# Patient Record
Sex: Female | Born: 1955 | Hispanic: No | Smoking: Never smoker
Health system: Southern US, Community
[De-identification: ages and names within clinical notes are randomized; demographics above are authoritative.]

## PROBLEM LIST (undated history)

## (undated) DIAGNOSIS — M1990 Unspecified osteoarthritis, unspecified site: Secondary | ICD-10-CM

## (undated) HISTORY — PX: MYOMECTOMY: SHX85

## (undated) HISTORY — PX: COLONOSCOPY: SHX174

## (undated) HISTORY — DX: Unspecified osteoarthritis, unspecified site: M19.90

---

## 1998-12-22 ENCOUNTER — Other Ambulatory Visit: Admission: RE | Admit: 1998-12-22 | Discharge: 1998-12-22 | Payer: Self-pay | Admitting: Family Medicine

## 1999-02-16 ENCOUNTER — Other Ambulatory Visit: Admission: RE | Admit: 1999-02-16 | Discharge: 1999-02-16 | Payer: Self-pay | Admitting: Gynecology

## 1999-07-20 ENCOUNTER — Encounter: Payer: Self-pay | Admitting: Family Medicine

## 1999-07-20 ENCOUNTER — Encounter: Admission: RE | Admit: 1999-07-20 | Discharge: 1999-07-20 | Payer: Self-pay | Admitting: Family Medicine

## 1999-08-04 ENCOUNTER — Encounter: Payer: Self-pay | Admitting: Family Medicine

## 1999-08-04 ENCOUNTER — Encounter: Admission: RE | Admit: 1999-08-04 | Discharge: 1999-08-04 | Payer: Self-pay | Admitting: Family Medicine

## 1999-08-09 ENCOUNTER — Other Ambulatory Visit: Admission: RE | Admit: 1999-08-09 | Discharge: 1999-08-09 | Payer: Self-pay | Admitting: Gynecology

## 2000-09-15 ENCOUNTER — Emergency Department (HOSPITAL_COMMUNITY): Admission: EM | Admit: 2000-09-15 | Discharge: 2000-09-15 | Payer: Self-pay | Admitting: Emergency Medicine

## 2000-09-21 ENCOUNTER — Inpatient Hospital Stay (HOSPITAL_COMMUNITY): Admission: AD | Admit: 2000-09-21 | Discharge: 2000-09-21 | Payer: Self-pay | Admitting: Gynecology

## 2000-10-30 ENCOUNTER — Encounter: Admission: RE | Admit: 2000-10-30 | Discharge: 2000-10-30 | Payer: Self-pay | Admitting: Internal Medicine

## 2000-10-30 ENCOUNTER — Encounter: Payer: Self-pay | Admitting: Internal Medicine

## 2000-11-20 ENCOUNTER — Encounter: Payer: Self-pay | Admitting: Internal Medicine

## 2000-11-20 ENCOUNTER — Encounter: Admission: RE | Admit: 2000-11-20 | Discharge: 2000-11-20 | Payer: Self-pay | Admitting: Internal Medicine

## 2001-01-28 ENCOUNTER — Encounter: Admission: RE | Admit: 2001-01-28 | Discharge: 2001-01-28 | Payer: Self-pay | Admitting: Obstetrics and Gynecology

## 2001-01-28 ENCOUNTER — Encounter: Payer: Self-pay | Admitting: Obstetrics and Gynecology

## 2001-04-03 ENCOUNTER — Ambulatory Visit (HOSPITAL_COMMUNITY): Admission: RE | Admit: 2001-04-03 | Discharge: 2001-04-03 | Payer: Self-pay | Admitting: Obstetrics and Gynecology

## 2001-04-03 ENCOUNTER — Encounter: Payer: Self-pay | Admitting: Obstetrics and Gynecology

## 2002-01-08 ENCOUNTER — Encounter: Admission: RE | Admit: 2002-01-08 | Discharge: 2002-01-08 | Payer: Self-pay | Admitting: Internal Medicine

## 2002-01-08 ENCOUNTER — Encounter: Payer: Self-pay | Admitting: Internal Medicine

## 2002-04-29 ENCOUNTER — Encounter: Payer: Self-pay | Admitting: Obstetrics and Gynecology

## 2002-04-29 ENCOUNTER — Encounter: Admission: RE | Admit: 2002-04-29 | Discharge: 2002-04-29 | Payer: Self-pay | Admitting: Obstetrics and Gynecology

## 2002-06-10 ENCOUNTER — Ambulatory Visit (HOSPITAL_COMMUNITY): Admission: RE | Admit: 2002-06-10 | Discharge: 2002-06-10 | Payer: Self-pay | Admitting: *Deleted

## 2002-06-24 ENCOUNTER — Ambulatory Visit (HOSPITAL_COMMUNITY): Admission: RE | Admit: 2002-06-24 | Discharge: 2002-06-24 | Payer: Self-pay | Admitting: Obstetrics and Gynecology

## 2002-09-15 ENCOUNTER — Encounter: Payer: Self-pay | Admitting: Internal Medicine

## 2002-09-15 ENCOUNTER — Encounter: Admission: RE | Admit: 2002-09-15 | Discharge: 2002-09-15 | Payer: Self-pay | Admitting: Internal Medicine

## 2003-03-04 ENCOUNTER — Encounter: Payer: Self-pay | Admitting: Internal Medicine

## 2003-03-04 ENCOUNTER — Ambulatory Visit (HOSPITAL_COMMUNITY): Admission: RE | Admit: 2003-03-04 | Discharge: 2003-03-04 | Payer: Self-pay | Admitting: Internal Medicine

## 2003-03-05 ENCOUNTER — Ambulatory Visit (HOSPITAL_COMMUNITY): Admission: RE | Admit: 2003-03-05 | Discharge: 2003-03-05 | Payer: Self-pay | Admitting: Internal Medicine

## 2003-03-05 ENCOUNTER — Encounter: Payer: Self-pay | Admitting: Internal Medicine

## 2003-09-15 ENCOUNTER — Other Ambulatory Visit: Admission: RE | Admit: 2003-09-15 | Discharge: 2003-09-15 | Payer: Self-pay | Admitting: Family Medicine

## 2003-11-02 ENCOUNTER — Ambulatory Visit (HOSPITAL_COMMUNITY): Admission: RE | Admit: 2003-11-02 | Discharge: 2003-11-02 | Payer: Self-pay | Admitting: Family Medicine

## 2003-11-12 ENCOUNTER — Encounter: Admission: RE | Admit: 2003-11-12 | Discharge: 2003-11-12 | Payer: Self-pay | Admitting: Specialist

## 2004-10-11 ENCOUNTER — Other Ambulatory Visit: Admission: RE | Admit: 2004-10-11 | Discharge: 2004-10-11 | Payer: Self-pay | Admitting: Family Medicine

## 2004-10-17 ENCOUNTER — Encounter: Admission: RE | Admit: 2004-10-17 | Discharge: 2004-10-17 | Payer: Self-pay | Admitting: Family Medicine

## 2005-03-14 ENCOUNTER — Emergency Department (HOSPITAL_COMMUNITY): Admission: EM | Admit: 2005-03-14 | Discharge: 2005-03-14 | Payer: Self-pay | Admitting: Emergency Medicine

## 2005-04-29 ENCOUNTER — Emergency Department (HOSPITAL_COMMUNITY): Admission: EM | Admit: 2005-04-29 | Discharge: 2005-04-29 | Payer: Self-pay | Admitting: Emergency Medicine

## 2005-05-03 ENCOUNTER — Encounter: Admission: RE | Admit: 2005-05-03 | Discharge: 2005-05-30 | Payer: Self-pay | Admitting: Family Medicine

## 2005-10-15 ENCOUNTER — Ambulatory Visit (HOSPITAL_COMMUNITY): Admission: RE | Admit: 2005-10-15 | Discharge: 2005-10-15 | Payer: Self-pay | Admitting: Internal Medicine

## 2005-10-31 ENCOUNTER — Ambulatory Visit (HOSPITAL_COMMUNITY): Admission: RE | Admit: 2005-10-31 | Discharge: 2005-10-31 | Payer: Self-pay | Admitting: Obstetrics and Gynecology

## 2006-11-25 ENCOUNTER — Other Ambulatory Visit: Admission: RE | Admit: 2006-11-25 | Discharge: 2006-11-25 | Payer: Self-pay | Admitting: Obstetrics and Gynecology

## 2007-01-13 ENCOUNTER — Encounter: Admission: RE | Admit: 2007-01-13 | Discharge: 2007-01-13 | Payer: Self-pay | Admitting: Obstetrics and Gynecology

## 2007-11-17 ENCOUNTER — Ambulatory Visit (HOSPITAL_COMMUNITY): Admission: RE | Admit: 2007-11-17 | Discharge: 2007-11-17 | Payer: Self-pay | Admitting: Family Medicine

## 2008-12-09 ENCOUNTER — Ambulatory Visit: Payer: Self-pay | Admitting: Internal Medicine

## 2008-12-24 ENCOUNTER — Ambulatory Visit: Payer: Self-pay | Admitting: Internal Medicine

## 2009-11-16 ENCOUNTER — Encounter: Admission: RE | Admit: 2009-11-16 | Discharge: 2009-11-16 | Payer: Self-pay | Admitting: Family Medicine

## 2009-11-24 ENCOUNTER — Ambulatory Visit (HOSPITAL_COMMUNITY): Admission: RE | Admit: 2009-11-24 | Discharge: 2009-11-24 | Payer: Self-pay | Admitting: Family Medicine

## 2010-08-06 ENCOUNTER — Encounter: Payer: Self-pay | Admitting: *Deleted

## 2010-08-06 ENCOUNTER — Encounter: Payer: Self-pay | Admitting: Family Medicine

## 2010-11-23 ENCOUNTER — Other Ambulatory Visit (HOSPITAL_COMMUNITY): Payer: Self-pay | Admitting: Family Medicine

## 2010-11-23 DIAGNOSIS — M8580 Other specified disorders of bone density and structure, unspecified site: Secondary | ICD-10-CM

## 2010-11-23 DIAGNOSIS — Z1231 Encounter for screening mammogram for malignant neoplasm of breast: Secondary | ICD-10-CM

## 2010-12-01 ENCOUNTER — Ambulatory Visit (HOSPITAL_COMMUNITY): Payer: Self-pay

## 2010-12-07 ENCOUNTER — Ambulatory Visit (HOSPITAL_COMMUNITY)
Admission: RE | Admit: 2010-12-07 | Discharge: 2010-12-07 | Disposition: A | Discharge status: Home / Self Care | Payer: PRIVATE HEALTH INSURANCE | Source: Ambulatory Visit | Attending: Family Medicine | Admitting: Family Medicine

## 2010-12-07 DIAGNOSIS — Z1231 Encounter for screening mammogram for malignant neoplasm of breast: Secondary | ICD-10-CM | POA: Insufficient documentation

## 2010-12-07 DIAGNOSIS — M8580 Other specified disorders of bone density and structure, unspecified site: Secondary | ICD-10-CM

## 2010-12-07 DIAGNOSIS — Z780 Asymptomatic menopausal state: Secondary | ICD-10-CM | POA: Insufficient documentation

## 2010-12-07 DIAGNOSIS — Z13820 Encounter for screening for osteoporosis: Secondary | ICD-10-CM | POA: Insufficient documentation

## 2011-12-29 ENCOUNTER — Encounter (HOSPITAL_COMMUNITY): Payer: Self-pay

## 2011-12-29 ENCOUNTER — Emergency Department (INDEPENDENT_AMBULATORY_CARE_PROVIDER_SITE_OTHER)
Admission: EM | Admit: 2011-12-29 | Discharge: 2011-12-29 | Disposition: A | Discharge status: Home / Self Care | Payer: Worker's Compensation | Source: Home / Self Care | Attending: Emergency Medicine | Admitting: Emergency Medicine

## 2011-12-29 ENCOUNTER — Emergency Department (INDEPENDENT_AMBULATORY_CARE_PROVIDER_SITE_OTHER): Payer: Worker's Compensation

## 2011-12-29 DIAGNOSIS — T148XXA Other injury of unspecified body region, initial encounter: Secondary | ICD-10-CM

## 2011-12-29 MED ORDER — DICLOFENAC SODIUM 75 MG PO TBEC: 75.0000 mg | DELAYED_RELEASE_TABLET | Freq: Two times a day (BID) | ORAL | Status: AC

## 2011-12-29 NOTE — ED Notes (Signed)
Breath alcohol testing completed at 1555.  Patient given water to drink and will attempt UDS when patient is brought to treatment room

## 2011-12-29 NOTE — Discharge Instructions (Signed)
Bone Bruise  A bone bruise is a small hidden fracture of the bone. It typically occurs with bones located close to the surface of the skin.  SYMPTOMS  The pain lasts longer than a normal bruise.   The bruised area is difficult to use.   There may be discoloration or swelling of the bruised area.   When a bone bruise is found with injury to the anterior cruciate ligament (in the knee) there is often an increased:   Amount of fluid in the knee   Time the fluid in the knee lasts.   Number of days until you are walking normally and regaining the motion you had before the injury.   Number of days with pain from the injury.  DIAGNOSIS  It can only be seen on X-rays known as MRIs. This stands for magnetic resonance imaging. A regular X-ray taken of a bone bruise would appear to be normal. A bone bruise is a common injury in the knee and the heel bone (calcaneus). The problems are similar to those produced by stress fractures, which are bone injuries caused by overuse. A bone bruise may also be a sign of other injuries. For example, bone bruises are commonly found where an anterior cruciate ligament (ACL) in the knee has been pulled away from the bone (ruptured). A ligament is a tough fibrous material that connects bones together to make our joints stable. Bruises of the bone last a lot longer than bruises of the muscle or tissues beneath the skin. Bone bruises can last from days to months and are often more severe and painful than other bruises. TREATMENT Because bone bruises are sudden injuries you cannot often prevent them, other than by being extremely careful. Some things you can do to improve the condition are:  Apply ice to the sore area for 15 to 20 minutes, 3 to 4 times per day while awake for the first 2 days. Put the ice in a plastic bag, and place a towel between the bag of ice and your skin.   Keep your bruised area raised (elevated) when possible to lessen swelling.   For activity:     Use crutches when necessary; do not put weight on the injured leg until you are no longer tender.   You may walk on your affected part as the pain allows, or as instructed.   Start weight bearing gradually on the bruised part.   Continue to use crutches or a cane until you can stand without causing pain, or as instructed.   If a plaster splint was applied, wear the splint until you are seen for a follow-up examination. Rest it on nothing harder than a pillow the first 24 hours. Do not put weight on it. Do not get it wet. You may take it off to take a shower or bath.   If an air splint was applied, more air may be blown into or out of the splint as needed for comfort. You may take it off at night and to take a shower or bath.   Wiggle your toes in the splint several times per day if you are able.   You may have been given an elastic bandage to use with the plaster splint or alone. The splint is too tight if you have numbness, tingling or if your foot becomes cold and blue. Adjust the bandage to make it comfortable.   Only take over-the-counter or prescription medicines for pain, discomfort, or fever as directed by   discomfort, or fever as directed by your caregiver.   Follow all instructions for follow up with your caregiver. This includes any orthopedic referrals, physical therapy, and rehabilitation. Any delay in obtaining necessary care could result in a delay or failure of the bones to heal.  SEEK MEDICAL CARE IF:    You have an increase in bruising, swelling, or pain.   You notice coldness of your toes.   You do not get pain relief with medications.  SEEK IMMEDIATE MEDICAL CARE IF:    Your toes are numb or blue.   You have severe pain not controlled with medications.   If any of the problems that caused you to seek care are becoming worse.  Document Released: 09/22/2003 Document Revised: 06/21/2011 Document Reviewed: 02/04/2008  [employer] Patient Information 2012 [employer].

## 2011-12-29 NOTE — ED Provider Notes (Signed)
Chief Complaint  Patient presents with  . 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 She landed on her right side. She did not hit her head and there was no loss of consciousness. Right now she has pain in in her right iliac crest area. This radiates into the buttock and towards the hip. She had some minor lower back pain and minor pain in her knee as well but these have gotten better. Most of the pain right now is localized in the buttock and over the iliac crest. She is able to ambulate but it hurts a little bit to stand for long period of time. She denies any numbness or tingling in the leg and has not had any blood in her urine.  Review of Systems:  Other than noted above, the patient denies any of the following symptoms: Systemic:  No fevers or chills. Eye:  No diplopia or blurred vision. ENT:  No headache, facial pain, or bleeding from the nose or ears.  No loose or broken teeth. Neck:  No neck pain or stiffnes. Resp:  No shortness of breath. Cardiac:  No chest pain. No palpitations, dizziness, syncope or fainting. GI:  No abdominal pain. No nausea, vomiting, or diarrhea. GU:  No blood in urine. M-S:  No extremity pain, swelling, bruising, limited ROM, neck or back pain. Neuro:  No headache, loss of consciousness, seizure activity, dizziness, vertigo, paresthesias, numbness, or weakness.  No difficulty with speech or ambulation.   PMFSH:  Past medical history, family history, social history, meds, and allergies were reviewed.  Physical Exam:   Vital signs:  BP 151/90  Pulse 55  Temp 98.1 F (36.7 C) (Oral)  Resp 16  SpO2 98% General:  Alert, oriented and in no distress. Eye:  PERRL, full EOMs. ENT:  No cranial or facial tenderness to palpation. Neck:  No tenderness to palpation.  Full ROM without pain. Heart:  Regular rhythm.  No extrasystoles, gallops, or murmers. Lungs:  No  chest wall tenderness to palpation. Breath sounds clear and equal bilaterally.  No wheezes, rales or rhonchi. Abdomen:  Non tender. She did have tenderness to palpation over the right iliac crest in the buttock. No swelling, bruising, or deformity. Back:  Non tender to palpation.  Full ROM without pain. Extremities:  No tenderness, swelling, bruising or deformity.  Full ROM of all joints without pain.  Pulses full.  Brisk capillary refill. Neuro:  Alert and oriented times 3.  Cranial nerves intact.  No muscle weakness.  Sensation intact to light touch.  Gait normal. Skin:  No bruising, abrasions, or lacerations.  Radiology:  Dg Pelvis 1-2 Views  12/29/2011  *RADIOLOGY REPORT*  Clinical Data: History of fall complaining of pelvic pain.  PELVIS - 1-2 VIEW  Comparison: No priors.  Findings: Bony pelvic ring appears intact.  Visualized portions of the proximal femurs also appear intact.  The femoral heads project over the acetabula bilaterally (proper location cannot be confirmed without the presence of lateral views).  IMPRESSION: 1.  No evidence of acute bony trauma to the pelvis.  Original Report Authenticated By: Florencia Reasons, M.D.    Assessment:  The encounter diagnosis was Contusion.  Plan:   1.  The following meds were prescribed:   New Prescriptions   DICLOFENAC (VOLTAREN) 75 MG EC TABLET    Take 1 tablet (75 mg total) by mouth  2 (two) times daily.   2.  The patient was instructed in symptomatic care and handouts were given. 3.  The patient was told to return if becoming worse in any way, if no better in 3 or 4 days, and given some red flag symptoms that would indicate earlier return.  Follow up:  The patient was told to [facility].     Reuben Likes, MD 12/29/11 [phone]

## 2011-12-29 NOTE — ED Notes (Signed)
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  She reported accident to the charge nurse Semona.

## 2012-02-05 ENCOUNTER — Other Ambulatory Visit: Payer: Self-pay | Admitting: Physician Assistant

## 2012-02-05 ENCOUNTER — Other Ambulatory Visit: Payer: Self-pay | Admitting: Family Medicine

## 2012-02-05 DIAGNOSIS — Z1231 Encounter for screening mammogram for malignant neoplasm of breast: Secondary | ICD-10-CM

## 2012-05-10 ENCOUNTER — Ambulatory Visit (INDEPENDENT_AMBULATORY_CARE_PROVIDER_SITE_OTHER): Payer: PRIVATE HEALTH INSURANCE | Admitting: Emergency Medicine

## 2012-05-10 VITALS — BP 153/82 | HR 60 | Temp 97.9°F | Resp 16 | Ht 61.0 in | Wt 152.0 lb

## 2012-05-10 DIAGNOSIS — M549 Dorsalgia, unspecified: Secondary | ICD-10-CM

## 2012-05-10 DIAGNOSIS — J018 Other acute sinusitis: Secondary | ICD-10-CM

## 2012-05-10 DIAGNOSIS — S335XXA Sprain of ligaments of lumbar spine, initial encounter: Secondary | ICD-10-CM

## 2012-05-10 MED ORDER — PSEUDOEPHEDRINE-GUAIFENESIN ER 60-600 MG PO TB12: 1.0000 | ORAL_TABLET | Freq: Two times a day (BID) | ORAL | Status: AC

## 2012-05-10 MED ORDER — CYCLOBENZAPRINE HCL 10 MG PO TABS: 10.0000 mg | ORAL_TABLET | Freq: Three times a day (TID) | ORAL | Status: DC | PRN

## 2012-05-10 MED ORDER — NAPROXEN SODIUM 550 MG PO TABS: 550.0000 mg | ORAL_TABLET | Freq: Two times a day (BID) | ORAL | Status: AC

## 2012-05-10 MED ORDER — AMOXICILLIN-POT CLAVULANATE 875-125 MG PO TABS: 1.0000 | ORAL_TABLET | Freq: Two times a day (BID) | ORAL | Status: DC

## 2012-05-10 NOTE — Progress Notes (Signed)
[facility] [address], [place] [address] [phone]  Date:  05/10/2012   Name:  Kathleen Lawrence   DOB:  1955/09/16   MRN:  [id number]  PCP:  No primary provider on file.    Chief Complaint: Otalgia, Nasal Congestion and Back Pain   History of Present Illness:  Kathleen Lawrence is a 56 y.o. very pleasant female patient who presents with the following:  Injured back in a fall in June.  xrayed at the time and treated with meds and PT.  Since has experienced chronic non radiating pain in lumbar area with no neuro symptoms.  Tolerates with NSAID's.  No recent injury or overuse.  History of prior sinusitis treated with antibiotic, perhaps a year ago.  Now has recurrent post nasal drainage and a green nasal discharge.  Has pain in throat and both ears.  Says just began a couple days ago.  Has nasal congestion and pressure in both maxillary and frontal sinuses.  No fever or chills. No wheezing or shortness of breath.  No nausea or vomiting. No headache.  There is no problem list on file for this patient.   History reviewed. No pertinent past medical history.  History reviewed. No pertinent past surgical history.  History  Substance Use Topics  . Smoking status: Never Smoker   . Smokeless tobacco: Not on file  . Alcohol Use: No    History reviewed. No pertinent family history.  Allergies  Allergen Reactions  . Clopidogrel Bisulfate     REACTION: unspecified    Medication list has been reviewed and updated.  Current Outpatient Prescriptions on File Prior to Visit  Medication Sig Dispense Refill  . acetaminophen (TYLENOL) 325 MG tablet Take 650 mg by mouth every 6 (six) hours as needed.      . calcium citrate-vitamin D 200-200 MG-UNIT TABS Take 1 tablet by mouth daily.      . Multiple Vitamin (MULTIVITAMIN) capsule Take 1 capsule by mouth daily.      . diclofenac (VOLTAREN) 75 MG EC tablet Take 1 tablet (75 mg total) by mouth 2 (two) times daily.   20 tablet  0    Review of Systems:  As per HPI, otherwise negative.    Physical Examination: Filed Vitals:   05/10/12 1648  BP: 153/82  Pulse: 60  Temp: 97.9 F (36.6 C)  Resp: 16   Filed Vitals:   05/10/12 1648  Height: 5\' 1"  (1.549 m)  Weight: 152 lb (68.947 kg)   Body mass index is 28.72 kg/(m^2). Ideal Body Weight: Weight in (lb) to have BMI = 25: 132   GEN: WDWN, NAD, Non-toxic, A & O x 3  No rash or sepsis HEENT: Atraumatic, Normocephalic. Neck supple. No masses, No LAD.  Oropharynx negative Ears and Nose: No external deformity.  TM negative  Nasal congestion and tenderness maxillary sinuses CV: RRR, No M/G/R. No JVD. No thrill. No extra heart sounds. PULM: CTA B, no wheezes, crackles, rhonchi. No retractions. No resp. distress. No accessory muscle use. ABD: S, NT, ND, +BS. No rebound. No HSM. EXTR: No c/c/e NEURO Normal gait.  PSYCH: Normally interactive. Conversant. Not depressed or anxious appearing.  Calm demeanor.    Assessment and Plan: Sinusitis Back pain augmentin mucinex Anaprox flexeril  [name], MD

## 2012-05-22 NOTE — Progress Notes (Signed)
Reviewed and agree.

## 2013-11-10 ENCOUNTER — Other Ambulatory Visit: Payer: Self-pay | Admitting: Obstetrics & Gynecology

## 2013-11-10 ENCOUNTER — Other Ambulatory Visit (HOSPITAL_COMMUNITY)
Admission: RE | Admit: 2013-11-10 | Discharge: 2013-11-10 | Disposition: A | Discharge status: Home / Self Care | Payer: PRIVATE HEALTH INSURANCE | Source: Ambulatory Visit | Attending: Obstetrics & Gynecology | Admitting: Obstetrics & Gynecology

## 2013-11-10 DIAGNOSIS — Z124 Encounter for screening for malignant neoplasm of cervix: Secondary | ICD-10-CM | POA: Insufficient documentation

## 2013-11-10 DIAGNOSIS — Z1151 Encounter for screening for human papillomavirus (HPV): Secondary | ICD-10-CM | POA: Insufficient documentation

## 2013-11-18 ENCOUNTER — Other Ambulatory Visit: Payer: Self-pay | Admitting: Internal Medicine

## 2013-11-18 ENCOUNTER — Ambulatory Visit
Admission: RE | Admit: 2013-11-18 | Discharge: 2013-11-18 | Disposition: A | Discharge status: Home / Self Care | Payer: PRIVATE HEALTH INSURANCE | Source: Ambulatory Visit | Attending: Internal Medicine | Admitting: Internal Medicine

## 2013-11-18 DIAGNOSIS — E010 Iodine-deficiency related diffuse (endemic) goiter: Secondary | ICD-10-CM

## 2013-11-18 DIAGNOSIS — M25539 Pain in unspecified wrist: Secondary | ICD-10-CM

## 2013-11-18 DIAGNOSIS — M25559 Pain in unspecified hip: Secondary | ICD-10-CM

## 2013-11-20 ENCOUNTER — Other Ambulatory Visit: Payer: Self-pay

## 2013-11-20 ENCOUNTER — Ambulatory Visit
Admission: RE | Admit: 2013-11-20 | Discharge: 2013-11-20 | Disposition: A | Discharge status: Home / Self Care | Payer: PRIVATE HEALTH INSURANCE | Source: Ambulatory Visit | Attending: Internal Medicine | Admitting: Internal Medicine

## 2013-11-20 DIAGNOSIS — E010 Iodine-deficiency related diffuse (endemic) goiter: Secondary | ICD-10-CM

## 2014-12-02 ENCOUNTER — Encounter: Payer: Self-pay | Admitting: Internal Medicine

## 2015-11-05 ENCOUNTER — Ambulatory Visit (INDEPENDENT_AMBULATORY_CARE_PROVIDER_SITE_OTHER): Payer: PRIVATE HEALTH INSURANCE | Admitting: Family Medicine

## 2015-11-05 ENCOUNTER — Ambulatory Visit (INDEPENDENT_AMBULATORY_CARE_PROVIDER_SITE_OTHER): Payer: PRIVATE HEALTH INSURANCE

## 2015-11-05 VITALS — BP 150/81 | HR 67 | Temp 98.1°F | Resp 16 | Ht 61.0 in | Wt 147.0 lb

## 2015-11-05 DIAGNOSIS — R2681 Unsteadiness on feet: Secondary | ICD-10-CM | POA: Diagnosis not present

## 2015-11-05 DIAGNOSIS — M25572 Pain in left ankle and joints of left foot: Secondary | ICD-10-CM

## 2015-11-05 DIAGNOSIS — R609 Edema, unspecified: Secondary | ICD-10-CM

## 2015-11-05 DIAGNOSIS — R030 Elevated blood-pressure reading, without diagnosis of hypertension: Secondary | ICD-10-CM | POA: Diagnosis not present

## 2015-11-05 DIAGNOSIS — R600 Localized edema: Secondary | ICD-10-CM | POA: Diagnosis not present

## 2015-11-05 DIAGNOSIS — M5432 Sciatica, left side: Secondary | ICD-10-CM | POA: Diagnosis not present

## 2015-11-05 DIAGNOSIS — M5431 Sciatica, right side: Secondary | ICD-10-CM

## 2015-11-05 DIAGNOSIS — IMO0001 Reserved for inherently not codable concepts without codable children: Secondary | ICD-10-CM

## 2015-11-05 MED ORDER — MELOXICAM 15 MG PO TABS: 15.0000 mg | ORAL_TABLET | Freq: Every day | ORAL | Status: DC

## 2015-11-05 NOTE — Progress Notes (Signed)
By signing my name below I, Tereasa Coop, attest that this documentation has been prepared under the direction and in the presence of Delman Cheadle, MD. Electonically Signed. Tereasa Coop, Scribe 11/05/2015 at 4:03 PM   Subjective:    Patient ID: Kathleen Lawrence, female    DOB: [birth year], 60 y.o.   MRN: [id number]  Chief Complaint  Patient presents with  . Foot Pain    left, x 1 month  . Back Pain    HPI Kathleen Lawrence is a 60 y.o. female who presents to the [facility] complaining of left foot pain for the past month. Pt states pain is improved when she rests and is worsened when she walks on it.  Pt states that 6 months ago pt fell down her stairs and landed on the lateral aspect of her left foot with the foot inverted. Pt states that the pain in her ankle has resolved, however the pain in her heel has persisted. Pt also reports swelling in rt ankle. Pt states that she did not have an xray or any evaluation after the fall.  Pt also c/o back pain radiating down both legs bilat. Pt states back pain and radiation down both legs is chronic and the left heel pain is new and different.  Pt states she wears pressure hose normally to keep bilat lower leg edema. Pt states that she does not have any swelling in the morning when she wakes up. Pt states she has some bilat lower leg swelling today because she did not wear her pressure stockings. Pt also reports some mild thigh and calf cramping when she walks a lot at work.   Pt states she has an annual physical with her PCP next month.    No past medical history on file.    Current outpatient prescriptions:  .  calcium citrate-vitamin D 200-200 MG-UNIT TABS, Take 1 tablet by mouth daily., Disp: , Rfl:  .  acetaminophen (TYLENOL) 325 MG tablet, Take 650 mg by mouth every 6 (six) hours as needed. Reported on 11/05/2015, Disp: , Rfl:  .  amoxicillin-clavulanate (AUGMENTIN) 875-125 MG per tablet, Take 1 tablet by mouth 2 (two)  times daily. (Patient not taking: Reported on 11/05/2015), Disp: 20 tablet, Rfl: 0 .  cyclobenzaprine (FLEXERIL) 10 MG tablet, Take 1 tablet (10 mg total) by mouth 3 (three) times daily as needed for muscle spasms. (Patient not taking: Reported on 11/05/2015), Disp: 30 tablet, Rfl: 0 .  Multiple Vitamin (MULTIVITAMIN) capsule, Take 1 capsule by mouth daily. Reported on 11/05/2015, Disp: , Rfl:   Allergies  Allergen Reactions  . Clopidogrel Bisulfate     REACTION: unspecified   Depression screen [facility] 2/9 11/05/2015  Decreased Interest 0  Down, Depressed, Hopeless 0  PHQ - 2 Score 0        Review of Systems  Constitutional: Positive for activity change. Negative for fever, chills and unexpected weight change.  Gastrointestinal: Negative for abdominal pain.  Musculoskeletal: Positive for myalgias, back pain, joint swelling, arthralgias and gait problem.  Skin: Negative for color change, rash and wound.  Neurological: Negative for weakness and numbness.  Hematological: Does not bruise/bleed easily.       Objective:  BP 150/81 mmHg  Pulse 67  Temp(Src) 98.1 F (36.7 C)  Resp 16  Ht 5\' 1"  (1.549 m)  Wt 147 lb (66.679 kg)  BMI 27.79 kg/m2  Physical Exam  Constitutional: She is oriented to person, place, and time. She appears well-developed  and well-nourished. No distress.  HENT:  Head: Normocephalic and atraumatic.  Eyes: Conjunctivae are normal. Pupils are equal, round, and reactive to light.  Neck: Neck supple.  Cardiovascular: Normal rate.   Pulses:      Dorsalis pedis pulses are 2+ on the right side, and 2+ on the left side.       Posterior tibial pulses are 2+ on the right side.  Left PT pulse unable to palpate due to edema.  Pulmonary/Chest: Effort normal.  Musculoskeletal: Normal range of motion.       Lumbar back: She exhibits tenderness (L5 midline tenderness and L5 left paraspinal tenderness).       Right foot: There is no tenderness.       Left foot: There is no  tenderness.  Pt has trace pitting edema on rt ankle and 1+ pitting edema on left ankle, feet bilat are spared.  Neurological: She is alert and oriented to person, place, and time. Gait (pt inverting left ankle and everting rt ankle upon ambulation) abnormal.  Reflex Scores:      Patellar reflexes are 2+ on the right side and 2+ on the left side.      Achilles reflexes are 2+ on the right side and 2+ on the left side. Negative straight leg raise bilat  Skin: Skin is warm and dry.  Excoriation and erythema over dorsum of left foot.  Psychiatric: She has a normal mood and affect. Her behavior is normal.  Nursing note and vitals reviewed.  Dg Foot Complete Left  11/05/2015  CLINICAL DATA:  worsening pain across mid foot and heel since inversion foot/ankle injury 6 mos prior (See below for [facility] reading) EXAM: LEFT FOOT - COMPLETE 3+ VIEW COMPARISON:  None. FINDINGS: There is no evidence of fracture or dislocation. Small plantar spur is present. Soft tissues are unremarkable. IMPRESSION: No evidence for acute  abnormality. Electronically Signed   By: Nolon Nations M.D.   On: 11/05/2015 15:51        Assessment & Plan:  Pt was strongly recommended to discuss cramping and swelling with her PCP during her annual physical next month to ensure necessary labs are done.   1. Pain in joint, ankle and foot, left - unknown etiology - pt looks to be walking on the lateral aspect of her foot which may be causing strain - refer to podiatry for further eval - may benefit from orthotics  2. Elevated blood pressure   3. Bilateral sciatica   4. Lower leg edema   5. Unstable gait   6. Lipoedema     Orders Placed This Encounter  Procedures  . DG Foot Complete Left    Standing Status: Future     Number of Occurrences: 1     Standing Expiration Date: 11/04/2016    Order Specific Question:  Reason for Exam (SYMPTOM  OR DIAGNOSIS REQUIRED)    Answer:  worsening pain across mid foot and heal since inversion  foot/ankle injury 6 mos prior    Order Specific Question:  Is the patient pregnant?    Answer:  No    Order Specific Question:  Preferred imaging location?    Answer:  External  . Ambulatory referral to Podiatry    Referral Priority:  Routine    Referral Type:  Consultation    Referral Reason:  Specialty Services Required    Requested Specialty:  Podiatry    Number of Visits Requested:  1    Meds ordered this encounter  Medications  . meloxicam (MOBIC) 15 MG tablet    Sig: Take 1 tablet (15 mg total) by mouth daily.    Dispense:  30 tablet    Refill:  0    I personally performed the services described in this documentation, which was scribed in my presence. The recorded information has been reviewed and considered, and addended by me as needed.  Delman Cheadle, MD MPH

## 2015-11-05 NOTE — Patient Instructions (Addendum)
Foot Sprain A foot sprain is an injury to one of the strong bands of tissue (ligaments) that connect and support the many bones in your feet. The ligament can be stretched too much or it can tear. A tear can be either partial or complete. The severity of the sprain depends on how much of the ligament was damaged or torn. CAUSES A foot sprain is usually caused by suddenly twisting or pivoting your foot. RISK FACTORS This injury is more likely to occur in people who:  Play a sport, such as basketball or football.  Exercise or play a sport without warming up.  Start a new workout or sport.  Suddenly increase how long or hard they exercise or play a sport. SYMPTOMS Symptoms of this condition start soon after an injury and include:  Pain, especially in the arch of the foot.  Bruising.  Swelling.  Inability to walk or use the foot to support body weight. DIAGNOSIS This condition is diagnosed with a medical history and physical exam. You may also have imaging tests, such as:  X-rays to make sure there are no broken bones (fractures).  MRI to see if the ligament has torn. TREATMENT Treatment varies depending on the severity of your sprain. Mild sprains can be treated with rest, ice, compression, and elevation (RICE). If your ligament is overstretched or partially torn, treatment usually involves keeping your foot in a fixed position (immobilization) for a period of time. To help you do this, your health care provider will apply a bandage, splint, or walking boot to keep your foot from moving until it heals. You may also be advised to use crutches or a scooter for a few weeks to avoid bearing weight on your foot while it is healing. If your ligament is fully torn, you may need surgery to reconnect the ligament to the bone. After surgery, a cast or splint will be applied and will need to stay on your foot while it heals. Your health care provider may also suggest exercises or physical therapy  to strengthen your foot. HOME CARE INSTRUCTIONS If You Have a Bandage, Splint, or Walking Boot:  Wear it as directed by your health care provider. Remove it only as directed by your health care provider.  Loosen the bandage, splint, or walking boot if your toes become numb and tingle, or if they turn cold and blue. Bathing  If your health care provider approves bathing and showering, cover the bandage or splint with a watertight plastic bag to protect it from water. Do not let the bandage or splint get wet. Managing Pain, Stiffness, and Swelling   If directed, apply ice to the injured area:  Put ice in a plastic bag.  Place a towel between your skin and the bag.  Leave the ice on for 20 minutes, 2-3 times per day.  Move your toes often to avoid stiffness and to lessen swelling.  Raise (elevate) the injured area above the level of your heart while you are sitting or lying down. Driving  Do not drive or operate heavy machinery while taking pain medicine.  Do not drive while wearing a bandage, splint, or walking boot on a foot that you use for driving. Activity  Rest as directed by your health care provider.  Do not use the injured foot to support your body weight until your health care provider says that you can. Use crutches or other supportive devices as directed by your health care provider.  Ask your health care  provider what activities are safe for you. Gradually increase how much and how far you walk until your health care provider says it is safe to return to full activity.  Do any exercise or physical therapy as directed by your health care provider. General Instructions  If a splint was applied, do not put pressure on any part of it until it is fully hardened. This may take several hours.  Take medicines only as directed by your health care provider. These include over-the-counter medicines and prescription medicines.  Keep all follow-up visits as directed by your  health care provider. This is important.  When you can walk without pain, wear supportive shoes that have stiff soles. Do not wear flip-flops, and do not walk barefoot. SEEK MEDICAL CARE IF:  Your pain is not controlled with medicine.  Your bruising or swelling gets worse or does not get better with treatment.  Your splint or walking boot is damaged. SEEK IMMEDIATE MEDICAL CARE IF:  Your foot is numb or blue.  Your foot feels colder than normal.   This information is not intended to replace advice given to you by your health care provider. Make sure you discuss any questions you have with your health care provider.   Document Released: 12/22/2001 Document Revised: 11/16/2014 Document Reviewed: 05/05/2014 Elsevier Interactive Patient Education 2016 [employer].     IF you received an x-ray today, you will receive an invoice from [facility]. Please contact [facility] at [phone] with questions or concerns regarding your invoice.   IF you received labwork today, you will receive an invoice from [employer]. Please contact [facility] at [phone] with questions or concerns regarding your invoice.   Our billing staff will not be able to assist you with questions regarding bills from these companies.  You will be contacted with the lab results as soon as they are available. The fastest way to get your results is to activate your My Chart account. Instructions are located on the last page of this paperwork. If you have not heard from [place] regarding the results in 2 weeks, please contact this office.    DASH Eating Plan DASH stands for "Dietary Approaches to Stop Hypertension." The DASH eating plan is a healthy eating plan that has been shown to reduce high blood pressure (hypertension). Additional health benefits may include reducing the risk of type 2 diabetes mellitus, heart disease, and stroke. The DASH eating plan may also help with  weight loss. WHAT DO I NEED TO KNOW ABOUT THE DASH EATING PLAN? For the DASH eating plan, you will follow these general guidelines:  Choose foods with a percent daily value for sodium of less than 5% (as listed on the food label).  Use salt-free seasonings or herbs instead of table salt or sea salt.  Check with your health care provider or pharmacist before using salt substitutes.  Eat lower-sodium products, often labeled as "lower sodium" or "no salt added."  Eat fresh foods.  Eat more vegetables, fruits, and low-fat dairy products.  Choose whole grains. Look for the word "whole" as the first word in the ingredient list.  Choose fish and skinless chicken or [place] more often than red meat. Limit fish, poultry, and meat to 6 oz (170 g) each day.  Limit sweets, desserts, sugars, and sugary drinks.  Choose heart-healthy fats.  Limit cheese to 1 oz (28 g) per day.  Eat more home-cooked food and less restaurant, buffet, and fast food.  Limit fried foods.  Cook foods using methods other than frying.  Limit canned vegetables. If you do use them, rinse them well to decrease the sodium.  When eating at a restaurant, ask that your food be prepared with less salt, or no salt if possible. WHAT FOODS CAN I EAT? Seek help from a dietitian for individual calorie needs. Grains Whole grain or whole wheat bread. Brown rice. Whole grain or whole wheat pasta. Quinoa, bulgur, and whole grain cereals. Low-sodium cereals. Corn or whole wheat flour tortillas. Whole grain cornbread. Whole grain crackers. Low-sodium crackers. Vegetables Fresh or frozen vegetables (raw, steamed, roasted, or grilled). Low-sodium or reduced-sodium tomato and vegetable juices. Low-sodium or reduced-sodium tomato sauce and paste. Low-sodium or reduced-sodium canned vegetables.  Fruits All fresh, canned (in natural juice), or frozen fruits. Meat and Other Protein Products Ground beef (85% or leaner), grass-fed beef, or  beef trimmed of fat. Skinless chicken or [place]. Ground chicken or [place]. Pork trimmed of fat. All fish and seafood. Eggs. Dried beans, peas, or lentils. Unsalted nuts and seeds. Unsalted canned beans. Dairy Low-fat dairy products, such as skim or 1% milk, 2% or reduced-fat cheeses, low-fat ricotta or cottage cheese, or plain low-fat yogurt. Low-sodium or reduced-sodium cheeses. Fats and Oils Tub margarines without trans fats. Light or reduced-fat mayonnaise and salad dressings (reduced sodium). Avocado. Safflower, olive, or canola oils. Natural peanut or almond butter. Other Unsalted popcorn and pretzels. The items listed above may not be a complete list of recommended foods or beverages. Contact your dietitian for more options. WHAT FOODS ARE NOT RECOMMENDED? Grains White bread. White pasta. White rice. Refined cornbread. Bagels and croissants. Crackers that contain trans fat. Vegetables Creamed or fried vegetables. Vegetables in a cheese sauce. Regular canned vegetables. Regular canned tomato sauce and paste. Regular tomato and vegetable juices. Fruits Dried fruits. Canned fruit in light or heavy syrup. Fruit juice. Meat and Other Protein Products Fatty cuts of meat. Ribs, chicken wings, bacon, sausage, bologna, salami, chitterlings, fatback, hot dogs, bratwurst, and packaged luncheon meats. Salted nuts and seeds. Canned beans with salt. Dairy Whole or 2% milk, cream, half-and-half, and cream cheese. Whole-fat or sweetened yogurt. Full-fat cheeses or blue cheese. Nondairy creamers and whipped toppings. Processed cheese, cheese spreads, or cheese curds. Condiments Onion and garlic salt, seasoned salt, table salt, and sea salt. Canned and packaged gravies. Worcestershire sauce. Tartar sauce. Barbecue sauce. Teriyaki sauce. Soy sauce, including reduced sodium. Steak sauce. Fish sauce. Oyster sauce. Cocktail sauce. Horseradish. Ketchup and mustard. Meat flavorings and tenderizers. Bouillon cubes.  Hot sauce. Tabasco sauce. Marinades. Taco seasonings. Relishes. Fats and Oils Butter, stick margarine, lard, shortening, ghee, and bacon fat. Coconut, palm kernel, or palm oils. Regular salad dressings. Other Pickles and olives. Salted popcorn and pretzels. The items listed above may not be a complete list of foods and beverages to avoid. Contact your dietitian for more information. WHERE CAN I FIND MORE INFORMATION? National Heart, Lung, and Blood Institute: travelstabloid.com   This information is not intended to replace advice given to you by your health care provider. Make sure you discuss any questions you have with your health care provider.   Document Released: 06/21/2011 Document Revised: 07/23/2014 Document Reviewed: 05/06/2013 Elsevier Interactive Patient Education [employer].

## 2016-08-03 ENCOUNTER — Ambulatory Visit (INDEPENDENT_AMBULATORY_CARE_PROVIDER_SITE_OTHER): Payer: PRIVATE HEALTH INSURANCE | Admitting: Physician Assistant

## 2016-08-03 VITALS — BP 130/70 | HR 80 | Temp 98.6°F | Resp 16 | Ht 60.0 in | Wt 141.6 lb

## 2016-08-03 DIAGNOSIS — R6889 Other general symptoms and signs: Secondary | ICD-10-CM | POA: Diagnosis not present

## 2016-08-03 DIAGNOSIS — R05 Cough: Secondary | ICD-10-CM

## 2016-08-03 DIAGNOSIS — R059 Cough, unspecified: Secondary | ICD-10-CM

## 2016-08-03 MED ORDER — HYDROCODONE-HOMATROPINE 5-1.5 MG/5ML PO SYRP: 5.0000 mL | ORAL_SOLUTION | Freq: Three times a day (TID) | ORAL | 0 refills | Status: DC | PRN

## 2016-08-03 MED ORDER — GUAIFENESIN ER 1200 MG PO TB12: 1.0000 | ORAL_TABLET | Freq: Two times a day (BID) | ORAL | 0 refills | Status: AC

## 2016-08-03 MED ORDER — ALBUTEROL SULFATE HFA 108 (90 BASE) MCG/ACT IN AERS: 2.0000 | INHALATION_SPRAY | Freq: Four times a day (QID) | RESPIRATORY_TRACT | 0 refills | Status: DC | PRN

## 2016-08-03 NOTE — Progress Notes (Signed)
Kathleen Lawrence  MRN: [id number] DOB: 1956/07/07  Subjective:  Pt presents to clinic with cold symptoms for about the last week.  She developed fevers with myalgias about 7 days ago and she is starting to feel better but she wants to see if there is anything that she can do for her symptoms.  She has a cough which is not productive but when she coughs her chest burns - she is not having SOB or wheezing but she does not want to take a deep breath due to the coughing.  Last fever documented for 24h ago.  Has been out of work this week due to her symptoms.  Using tylenol and OTC cough syrup  Review of Systems  Constitutional: Positive for appetite change (decreased), chills, fatigue and fever.  HENT: Positive for congestion, postnasal drip and rhinorrhea (? color).   Respiratory: Positive for cough and chest tightness (burning). Negative for shortness of breath and wheezing.        No h/o asthma, nonsmoker  Gastrointestinal: Positive for nausea (improved) and vomiting.  Musculoskeletal: Positive for myalgias (improved).  Neurological: Negative for headaches.  Psychiatric/Behavioral: Positive for sleep disturbance (2nd to cough).    There are no active problems to display for this patient.   Current Outpatient Prescriptions on File Prior to Visit  Medication Sig Dispense Refill  . acetaminophen (TYLENOL) 325 MG tablet Take 650 mg by mouth every 6 (six) hours as needed. Reported on 11/05/2015     No current facility-administered medications on file prior to visit.     Allergies  Allergen Reactions  . Clopidogrel Bisulfate     REACTION: unspecified    Pt patients past, family and social history were reviewed and updated.   Objective:  BP 130/70   Pulse 80   Temp 98.6 F (37 C) (Oral)   Resp 16   Ht 5' (1.524 m)   Wt 141 lb 9.6 oz (64.2 kg)   SpO2 95%   BMI 27.65 kg/m   Physical Exam  Constitutional: She is oriented to person, place, and time and well-developed,  well-nourished, and in no distress.  Pts to not feel well  HENT:  Head: Normocephalic and atraumatic.  Right Ear: Hearing, tympanic membrane, external ear and ear canal normal.  Left Ear: Hearing, tympanic membrane, external ear and ear canal normal.  Nose: Mucosal edema (red) and rhinorrhea (clear) present.  Mouth/Throat: Uvula is midline, oropharynx is clear and moist and mucous membranes are normal.  Eyes: Conjunctivae are normal.  Neck: Normal range of motion.  Cardiovascular: Normal rate, regular rhythm and normal heart sounds.   No murmur heard. Pulmonary/Chest: Effort normal and breath sounds normal. No respiratory distress. She has no wheezes. She has no rales. She exhibits no tenderness.  Lymphadenopathy:    She has no cervical adenopathy.       Right: No supraclavicular adenopathy present.       Left: No supraclavicular adenopathy present.  Neurological: She is alert and oriented to person, place, and time. Gait normal.  Skin: Skin is warm and dry.  Psychiatric: Mood, memory, affect and judgment normal.  Vitals reviewed.   Assessment and Plan :  Flu-like symptoms - Plan: Guaifenesin (MUCINEX MAXIMUM STRENGTH) 1200 MG TB12, albuterol (PROVENTIL HFA;VENTOLIN HFA) 108 (90 Base) MCG/ACT inhaler  Cough - Plan: HYDROcodone-homatropine (HYCODAN) 5-1.5 MG/5ML syrup   Pt appears to have had the flu but seems to be improving - she has no wheezing on exam but due to her chest  burning will give her an albuterol inhaler as this will likely help.  She was encouraged to use a humidifier to help with the chest burning.  Other symptomatic care instructions were given - she was given a note for work this week.  Windell Hummingbird PA-C  [facility] 08/03/2016 8:15 PM

## 2016-08-03 NOTE — Patient Instructions (Addendum)
  Please push fluids.  Tylenol and Motrin for fever and body aches.    A humidifier can help especially when the air is dry -if you do not have a humidifier you can boil a pot of water on the stove in your home to help with the dry air.  Nasal saline spray can be helpful to keep the mucus membranes moist and thin the nasal mucus    IF you received an x-ray today, you will receive an invoice from [facility]. Please contact [facility] at [phone] with questions or concerns regarding your invoice.   IF you received labwork today, you will receive an invoice from [facility]. Please contact [facility] at [phone] with questions or concerns regarding your invoice.   Our billing staff will not be able to assist you with questions regarding bills from these companies.  You will be contacted with the lab results as soon as they are available. The fastest way to get your results is to activate your My Chart account. Instructions are located on the last page of this paperwork. If you have not heard from us regarding the results in 2 weeks, please contact this office.      

## 2016-08-04 ENCOUNTER — Telehealth: Payer: Self-pay

## 2016-08-04 NOTE — Telephone Encounter (Signed)
Pt was just seen on Friday and is still not feeling any better and would like to talk with someone   Best number [phone]

## 2016-08-08 NOTE — Telephone Encounter (Signed)
LM call back if still having symptoms

## 2016-11-16 ENCOUNTER — Ambulatory Visit: Payer: PRIVATE HEALTH INSURANCE | Admitting: Podiatry

## 2016-12-06 ENCOUNTER — Ambulatory Visit (INDEPENDENT_AMBULATORY_CARE_PROVIDER_SITE_OTHER): Payer: PRIVATE HEALTH INSURANCE | Admitting: Podiatry

## 2016-12-06 ENCOUNTER — Ambulatory Visit (INDEPENDENT_AMBULATORY_CARE_PROVIDER_SITE_OTHER): Payer: PRIVATE HEALTH INSURANCE

## 2016-12-06 ENCOUNTER — Encounter: Payer: Self-pay | Admitting: Podiatry

## 2016-12-06 DIAGNOSIS — M722 Plantar fascial fibromatosis: Secondary | ICD-10-CM

## 2016-12-06 DIAGNOSIS — M21619 Bunion of unspecified foot: Secondary | ICD-10-CM

## 2016-12-06 DIAGNOSIS — M21629 Bunionette of unspecified foot: Secondary | ICD-10-CM

## 2016-12-06 MED ORDER — MELOXICAM 15 MG PO TABS: 15.0000 mg | ORAL_TABLET | Freq: Every day | ORAL | 1 refills | Status: AC

## 2016-12-06 MED ORDER — METHYLPREDNISOLONE 4 MG PO TBPK: ORAL_TABLET | ORAL | 0 refills | Status: DC

## 2016-12-06 NOTE — Patient Instructions (Signed)
Start with the medrol dose pack (steroid). Once this is complete go ahead and start the meloxicam. Do not take together.  Do stretching and icing daily.    Plantar Fasciitis Rehab Ask your health care provider which exercises are safe for you. Do exercises exactly as told by your health care provider and adjust them as directed. It is normal to feel mild stretching, pulling, tightness, or discomfort as you do these exercises, but you should stop right away if you feel sudden pain or your pain gets worse. Do not begin these exercises until told by your health care provider. Stretching and range of motion exercises These exercises warm up your muscles and joints and improve the movement and flexibility of your foot. These exercises also help to relieve pain. Exercise A: Plantar fascia stretch   1. Sit with your left / right leg crossed over your opposite knee. 2. Hold your heel with one hand with that thumb near your arch. With your other hand, hold your toes and gently pull them back toward the top of your foot. You should feel a stretch on the bottom of your toes or your foot or both. 3. Hold this stretch for__________ seconds. 4. Slowly release your toes and return to the starting position. Repeat __________ times. Complete this exercise __________ times a day. Exercise B: Gastroc, standing   1. Stand with your hands against a wall. 2. Extend your left / right leg behind you, and bend your front knee slightly. 3. Keeping your heels on the floor and keeping your back knee straight, shift your weight toward the wall without arching your back. You should feel a gentle stretch in your left / right calf. 4. Hold this position for __________ seconds. Repeat __________ times. Complete this exercise __________ times a day. Exercise C: Soleus, standing  1. Stand with your hands against a wall. 2. Extend your left / right leg behind you, and bend your front knee slightly. 3. Keeping your heels on the  floor, bend your back knee and slightly shift your weight over the back leg. You should feel a gentle stretch deep in your calf. 4. Hold this position for __________ seconds. Repeat __________ times. Complete this exercise __________ times a day. Exercise D: Gastrocsoleus, standing  1. Stand with the ball of your left / right foot on a step. The ball of your foot is on the walking surface, right under your toes. 2. Keep your other foot firmly on the same step. 3. Hold onto the wall or a railing for balance. 4. Slowly lift your other foot, allowing your body weight to press your heel down over the edge of the step. You should feel a stretch in your left / right calf. 5. Hold this position for __________ seconds. 6. Return both feet to the step. 7. Repeat this exercise with a slight bend in your left / right knee. Repeat __________ times with your left / right knee straight and __________ times with your left / right knee bent. Complete this exercise __________ times a day. Balance exercise This exercise builds your balance and strength control of your arch to help take pressure off your plantar fascia. Exercise E: Single leg stand  1. Without shoes, stand near a railing or in a doorway. You may hold onto the railing or door frame as needed. 2. Stand on your left / right foot. Keep your big toe down on the floor and try to keep your arch lifted. Do not let your  foot roll inward. 3. Hold this position for __________ seconds. 4. If this exercise is too easy, you can try it with your eyes closed or while standing on a pillow. Repeat __________ times. Complete this exercise __________ times a day. This information is not intended to replace advice given to you by your health care provider. Make sure you discuss any questions you have with your health care provider. Document Released: 07/02/2005 Document Revised: 03/06/2016 Document Reviewed: 05/16/2015 Elsevier Interactive Patient Education  2017  [employer].

## 2016-12-12 NOTE — Progress Notes (Signed)
Subjective:    Patient ID: Kathleen Lawrence, female   DOB: 61 y.o.   MRN: [id number]   HPI 61 year old female presents the also concerns of bilateral foot pain. She says she gets pain along the arch of the foot into the heel as well she also gets pain in the outside aspect of both of her feet. She states this been ongoing for about 1 year however has been gradually getting worse. She denies any recent injury or trauma. She has no numbness or tingling. She's any recent treatment. She states also shoes to bother him whatsoever shoe she wears. She has no other concerns today.   Review of Systems  All other systems reviewed and are negative.       Objective:  Physical Exam General: AAO x3, NAD  Dermatological: Skin is warm, dry and supple bilateral. Nails x 10 are well manicured; remaining integument appears unremarkable at this time. There are no open sores, no preulcerative lesions, no rash or signs of infection present.  Vascular: Dorsalis Pedis artery and Posterior Tibial artery pedal pulses are 2/4 bilateral with immedate capillary fill time.  There is no pain with calf compression, swelling, warmth, erythema.   Neruologic: Grossly intact via light touch bilateral. Vibratory intact via tuning fork bilateral.  Patellar and Achilles deep tendon reflexes 2+ bilateral. No Babinski or clonus noted bilateral.  Negative Tinel sign bilaterally  Musculoskeletal: Mild HAV. Moderate first bunions present on the right side worse the left there is tenderness along the lateral aspect fifth metatarsal head on the bunion sites. There is also discomfort on the arch of the foot on the plantar fascia. The plantar fascia appears to be intact. There is no area of pinnpoint bony tenderness or pain the vibratory sensation. There is no overlying edema, erythema, increase in warmth. Muscular strength 5/5 in all groups tested bilateral.  Gait: Unassisted, Nonantalgic.      Assessment:     61 year old female  bilateral foot pain. Likely result upon fascias comments there is bunions bilaterally.    Plan:     -Treatment options discussed including all alternatives, risks, and complications -Etiology of symptoms were discussed -X-rays were obtained and reviewed with the patient. On the right at this mild arthritic changes present. Mild tailors bunion. No evidence of acute fracture. -Discussed stretching exercises as well as supportive shoe gear and also recommended orthotics. -Will start a Medrol Dosepak. Once this was complete she can start meloxicam but not together. Hopefully this will help decrease some of her pain and inflammation as well. -Discussed supportive shoe gear as well -Follow-up as scheduled or sooner if needed.  Celesta Gentile, DPM

## 2017-01-10 ENCOUNTER — Ambulatory Visit: Payer: PRIVATE HEALTH INSURANCE | Admitting: Podiatry

## 2019-04-09 ENCOUNTER — Ambulatory Visit: Payer: Self-pay | Admitting: Family Medicine

## 2019-04-09 ENCOUNTER — Other Ambulatory Visit: Payer: Self-pay | Admitting: Family Medicine

## 2019-04-09 VITALS — BP 110/72 | HR 75

## 2019-04-09 MED ORDER — AMOXICILLIN-POT CLAVULANATE 875-125 MG PO TABS: 1.0000 | ORAL_TABLET | Freq: Two times a day (BID) | ORAL | 0 refills | Status: DC

## 2019-04-09 MED ORDER — IBUPROFEN 600 MG PO TABS: 600.0000 mg | ORAL_TABLET | Freq: Three times a day (TID) | ORAL | 0 refills | Status: DC | PRN

## 2019-04-09 NOTE — Progress Notes (Signed)
Subjective:     Patient ID: Kathleen Lawrence, female   DOB: 09/27/1955, 63 y.o.   MRN: [id number]  HPI Jayliah presents to the [facility] clinic today with complaints of left lower gum/jaw pain. States this pain has been intermittent for a long time but was worse yesterday. Reports swelling, pain with chewing and opening and closing her jaw. Denies fever, ear pain, sore throat, h/a, or vision changes. She has tried tylenol and hydrogen peroxide washes with some relief. She has not been to dentist in over a year when she had teeth pulled from left lower molars.   Review of Systems  Constitutional: Negative for chills, fatigue and fever.  HENT: Negative for ear discharge, ear pain, mouth sores, sinus pressure, sinus pain, sore throat and trouble swallowing.   Eyes: Negative for photophobia, pain and visual disturbance.  Respiratory: Negative for shortness of breath.   Neurological: Negative for dizziness and headaches.       Objective:   Physical Exam Vitals signs and nursing note reviewed.  Constitutional:      General: She is not in acute distress.    Appearance: Normal appearance. She is not toxic-appearing.  HENT:     Head: Normocephalic and atraumatic.     Jaw: Tenderness, swelling and pain on movement present.     Salivary Glands: Right salivary gland is not diffusely enlarged. Left salivary gland is not diffusely enlarged.     Comments: Mild swelling and tenderness to left lower jaw, no popping or clicking, no locking     Right Ear: Tympanic membrane normal.     Left Ear: Tympanic membrane normal.     Nose: Nose normal.     Mouth/Throat:     Mouth: Mucous membranes are moist.     Pharynx: Oropharynx is clear. No oropharyngeal exudate or posterior oropharyngeal erythema.  Eyes:     General:        Right eye: No discharge.        Left eye: No discharge.     Pupils: Pupils are equal, round, and reactive to light.  Neck:     Musculoskeletal: Normal range of motion and neck supple. No  neck rigidity.  Cardiovascular:     Rate and Rhythm: Normal rate and regular rhythm.     Heart sounds: Normal heart sounds.  Pulmonary:     Effort: Pulmonary effort is normal. No respiratory distress.     Breath sounds: Normal breath sounds.  Lymphadenopathy:     Cervical: No cervical adenopathy.  Skin:    General: Skin is warm and dry.     Findings: No bruising, erythema or rash.  Neurological:     General: No focal deficit present.     Mental Status: She is alert and oriented to person, place, and time.  Psychiatric:        Mood and Affect: Mood normal.        Behavior: Behavior normal.        Assessment:     Jaw pain      Plan:     1. Will treat pain, swelling, and inflammation with anti-inflammatory prn. Pt preferred ibuprofen. Take with food.  2. Will cover for potential periodontal infection with augmentin x 10 days. Recommend f/u with dentist. She should f/u if no improvement or worsening symptoms. She should go to urgent care if it is over the weekend.  3. Pt had no pain over temporal arteries, however given age will go ahead and check a sed  rate to r/o temporal arteritis. Will f/u based on results. If she develops severe pain, h/a, vision changes she should seek care in ED.

## 2019-04-10 LAB — CBC WITH DIFFERENTIAL/PLATELET
Absolute Monocytes: 442 cells/uL (ref 200–950)
Basophils Absolute: 28 cells/uL (ref 0–200)
Basophils Relative: 0.4 %
Eosinophils Absolute: 90 cells/uL (ref 15–500)
Eosinophils Relative: 1.3 %
HCT: 35.8 % (ref 35.0–45.0)
Hemoglobin: 11.7 g/dL (ref 11.7–15.5)
Lymphs Abs: 2532 cells/uL (ref 850–3900)
MCH: 29.3 pg (ref 27.0–33.0)
MCHC: 32.7 g/dL (ref 32.0–36.0)
MCV: 89.7 fL (ref 80.0–100.0)
MPV: 12.9 fL — ABNORMAL HIGH (ref 7.5–12.5)
Monocytes Relative: 6.4 %
Neutro Abs: 3809 cells/uL (ref 1500–7800)
Neutrophils Relative %: 55.2 %
Platelets: 200 10*3/uL (ref 140–400)
RBC: 3.99 10*6/uL (ref 3.80–5.10)
RDW: 11.7 % (ref 11.0–15.0)
Total Lymphocyte: 36.7 %
WBC: 6.9 10*3/uL (ref 3.8–10.8)

## 2019-04-10 LAB — SEDIMENTATION RATE: Sed Rate: 34 mm/h — ABNORMAL HIGH (ref 0–30)

## 2019-04-14 ENCOUNTER — Telehealth: Payer: Self-pay | Admitting: Family Medicine

## 2019-04-14 NOTE — Telephone Encounter (Signed)
Attempted to call patient to follow-up with her and give her lab results. Phone number provided does not work. Pt provided her email address, email sent requesting her to call clinic back when possible.

## 2019-04-16 ENCOUNTER — Telehealth: Payer: Self-pay | Admitting: Family Medicine

## 2019-04-16 NOTE — Telephone Encounter (Signed)
Attempted to call patient to follow-up x2. No answer, voicemail box full.

## 2019-04-23 ENCOUNTER — Encounter: Payer: Self-pay | Admitting: Family Medicine

## 2019-10-22 ENCOUNTER — Ambulatory Visit: Payer: PRIVATE HEALTH INSURANCE | Admitting: Family Medicine

## 2019-10-22 VITALS — BP 132/76 | HR 76 | Ht 62.0 in | Wt 125.0 lb

## 2019-10-22 DIAGNOSIS — Z789 Other specified health status: Secondary | ICD-10-CM

## 2019-10-22 NOTE — Progress Notes (Signed)
  Subjective:     Patient ID: Kathleen Lawrence, female   DOB: [birth year], 64 y.o.   MRN: [id number]  HPI [name] [facility] the [facility] today for her required wellness visit for her insurance. She does not currently have a PCP. She states her last mammogram was a long time ago, as well as her pap smear. She thinks she has had a colonoscopy within the last 10 years but is unsure of the date. She is doing well with eating healthy and exercising. She would like to get lab work done. She denies any health concerns today.   No past medical history on file. Allergies  Allergen Reactions  . Clopidogrel Bisulfate     REACTION: unspecified   No current outpatient medications on file.   Review of Systems  Constitutional: Negative for chills, fatigue, fever and unexpected weight change.  HENT: Negative for congestion, ear pain, sinus pressure, sinus pain and sore throat.   Eyes: Negative for discharge and visual disturbance.  Respiratory: Negative for cough, shortness of breath and wheezing.   Cardiovascular: Negative for chest pain and leg swelling.  Gastrointestinal: Negative for abdominal pain, blood in stool, constipation, diarrhea, nausea and vomiting.  Genitourinary: Negative for difficulty urinating and hematuria.  Skin: Negative for color change.  Neurological: Negative for dizziness, weakness, light-headedness and headaches.  Hematological: Negative for adenopathy.  All other systems reviewed and are negative.      Objective:   Physical Exam Vitals reviewed.  Constitutional:      General: She is not in acute distress.    Appearance: Normal appearance. She is well-developed.  HENT:     Head: Normocephalic and atraumatic.  Eyes:     General:        Right eye: No discharge.        Left eye: No discharge.  Cardiovascular:     Rate and Rhythm: Normal rate and regular rhythm.     Heart sounds: Normal heart sounds.  Pulmonary:     Effort: Pulmonary effort is normal.  No respiratory distress.     Breath sounds: Normal breath sounds.  Musculoskeletal:     Cervical back: Neck supple.  Skin:    General: Skin is warm and dry.  Neurological:     Mental Status: She is alert and oriented to person, place, and time.  Psychiatric:        Mood and Affect: Mood normal.        Behavior: Behavior normal.    Today's Vitals   10/22/19 1135  BP: 132/76  Pulse: 76  SpO2: 98%  Weight: 125 lb (56.7 kg)  Height: 5\' 2"  (1.575 m)   Body mass index is 22.86 kg/m.     Assessment:     Participant in health and wellness plan      Plan:     1. Strongly encouraged pt to get established with PCP. Gave number to [facility] physician referral service line to help her with this. She should have mammogram and pap smear done as well.  2. Encouraged continued healthy diet and exercise.  3. She will return fasting next week for lab draw. Will check lipid panel, cmp, and cbc.  4. F/u prn.

## 2019-10-29 ENCOUNTER — Other Ambulatory Visit: Payer: Self-pay | Admitting: Family Medicine

## 2019-10-30 LAB — COMPLETE METABOLIC PANEL WITH GFR
AG Ratio: 1.7 (calc) (ref 1.0–2.5)
ALT: 14 U/L (ref 6–29)
AST: 27 U/L (ref 10–35)
Albumin: 4.1 g/dL (ref 3.6–5.1)
Alkaline phosphatase (APISO): 88 U/L (ref 37–153)
BUN: 12 mg/dL (ref 7–25)
CO2: 27 mmol/L (ref 20–32)
Calcium: 9.2 mg/dL (ref 8.6–10.4)
Chloride: 106 mmol/L (ref 98–110)
Creat: 0.64 mg/dL (ref 0.50–0.99)
GFR, Est African American: 112 mL/min/{1.73_m2} (ref >=60)
GFR, Est Non African American: 96 mL/min/{1.73_m2} (ref >=60)
Globulin: 2.4 g/dL (calc) (ref 1.9–3.7)
Glucose, Bld: 102 mg/dL — ABNORMAL HIGH (ref 65–99)
Potassium: 4.4 mmol/L (ref 3.5–5.3)
Sodium: 143 mmol/L (ref 135–146)
Total Bilirubin: 0.4 mg/dL (ref 0.2–1.2)
Total Protein: 6.5 g/dL (ref 6.1–8.1)

## 2019-10-30 LAB — CBC
HCT: 35.9 % (ref 35.0–45.0)
Hemoglobin: 11.9 g/dL (ref 11.7–15.5)
MCH: 29.8 pg (ref 27.0–33.0)
MCHC: 33.1 g/dL (ref 32.0–36.0)
MCV: 89.8 fL (ref 80.0–100.0)
MPV: 12.5 fL (ref 7.5–12.5)
Platelets: 195 10*3/uL (ref 140–400)
RBC: 4 10*6/uL (ref 3.80–5.10)
RDW: 11.8 % (ref 11.0–15.0)
WBC: 5.1 10*3/uL (ref 3.8–10.8)

## 2019-10-30 LAB — LIPID PANEL
Cholesterol: 178 mg/dL (ref <200)
HDL: 70 mg/dL (ref >=50)
LDL Cholesterol (Calc): 93 mg/dL (calc)
Non-HDL Cholesterol (Calc): 108 mg/dL (calc) (ref <130)
Total CHOL/HDL Ratio: 2.5 (calc) (ref <5.0)
Triglycerides: 60 mg/dL (ref <150)

## 2019-10-30 LAB — VITAMIN D 25 HYDROXY (VIT D DEFICIENCY, FRACTURES): Vit D, 25-Hydroxy: 11 ng/mL — ABNORMAL LOW (ref 30–100)

## 2019-11-03 ENCOUNTER — Telehealth: Payer: Self-pay | Admitting: Family Medicine

## 2019-11-03 NOTE — Telephone Encounter (Signed)
Called pt to discuss lab results. Mailbox is full and unable to leave a message. Will attempt again later.

## 2019-11-10 ENCOUNTER — Telehealth: Payer: Self-pay | Admitting: Family Medicine

## 2019-11-10 NOTE — Telephone Encounter (Signed)
Called pt to discuss lab results. Unable to reach her, and unable to leave a voicemail d/t mailbox being full. Will try again later, if unable to reach again will mail results.

## 2019-11-12 ENCOUNTER — Encounter: Payer: Self-pay | Admitting: Family Medicine

## 2019-11-23 ENCOUNTER — Ambulatory Visit: Payer: PRIVATE HEALTH INSURANCE | Admitting: Nurse Practitioner

## 2019-12-16 ENCOUNTER — Encounter: Payer: Self-pay | Admitting: Nurse Practitioner

## 2019-12-16 ENCOUNTER — Other Ambulatory Visit (HOSPITAL_COMMUNITY)
Admission: RE | Admit: 2019-12-16 | Discharge: 2019-12-16 | Disposition: A | Discharge status: Home / Self Care | Payer: PRIVATE HEALTH INSURANCE | Source: Ambulatory Visit | Attending: Nurse Practitioner | Admitting: Nurse Practitioner

## 2019-12-16 ENCOUNTER — Ambulatory Visit (INDEPENDENT_AMBULATORY_CARE_PROVIDER_SITE_OTHER): Payer: PRIVATE HEALTH INSURANCE | Admitting: Nurse Practitioner

## 2019-12-16 ENCOUNTER — Encounter: Payer: Self-pay | Admitting: Gastroenterology

## 2019-12-16 ENCOUNTER — Other Ambulatory Visit: Payer: Self-pay

## 2019-12-16 VITALS — BP 124/64 | HR 75 | Temp 96.4°F | Ht 60.63 in | Wt 127.6 lb

## 2019-12-16 DIAGNOSIS — Z1211 Encounter for screening for malignant neoplasm of colon: Secondary | ICD-10-CM

## 2019-12-16 DIAGNOSIS — Z1159 Encounter for screening for other viral diseases: Secondary | ICD-10-CM

## 2019-12-16 DIAGNOSIS — Z124 Encounter for screening for malignant neoplasm of cervix: Secondary | ICD-10-CM | POA: Diagnosis not present

## 2019-12-16 DIAGNOSIS — E559 Vitamin D deficiency, unspecified: Secondary | ICD-10-CM

## 2019-12-16 DIAGNOSIS — N6331 Unspecified lump in axillary tail of the right breast: Secondary | ICD-10-CM | POA: Insufficient documentation

## 2019-12-16 DIAGNOSIS — E010 Iodine-deficiency related diffuse (endemic) goiter: Secondary | ICD-10-CM

## 2019-12-16 MED ORDER — VITAMIN D (ERGOCALCIFEROL) 1.25 MG (50000 UNIT) PO CAPS: 50000.0000 [IU] | ORAL_CAPSULE | ORAL | 0 refills | Status: DC

## 2019-12-16 NOTE — Patient Instructions (Addendum)
Thank you for choosing [facility] for your health needs  You will be contacted to schedule appt with GI and for mammogram.  Return to lab in 44months for repeat vitamin D.

## 2019-12-16 NOTE — Progress Notes (Signed)
Subjective:  Patient ID: Kathleen Lawrence, female    DOB: 10/01/1955  Age: 64 y.o. MRN: [id number]  CC: Establish Care (New Pt-CPE-pt is not fasting//pap and breast//overdue for mammogram and colonoscopy//no sure about tetanus//had blood work done 2 or 3 weeks ago and thinks Kathleen Lawrence has vitamin D defiency-wondered if we could use that blood work so Kathleen Lawrence don' have to get blood)  HPI Kathleen Lawrence denies any acute complaint today. Kathleen Lawrence needs to discuss vitamin D deficiency. Kathleen [facility]. Kathleen Lawrence also needs pelvic and breast exam completed today. Kathleen Lawrence also has hx of goiter, but denies any symptoms. I reviewed labs completed 04/2021L CBC, CMP, lipid panel, and vitamin D. Kathleen Lawrence works as Industrial/product designer at [employer], divorced, has Buyer, retail and 1 grandchild. unknown family medical hx.  Social History   Socioeconomic History  . Marital status: Legally Separated    Spouse name: Not on file  . Number of children: Not on file  . Years of education: Not on file  . Highest education level: Not on file  Occupational History  . Not on file  Tobacco Use  . Smoking status: Never Smoker  . Smokeless tobacco: Never Used  Substance and Sexual Activity  . Alcohol use: No  . Drug use: No  . Sexual activity: Not on file  Other Topics Concern  . Not on file  Social History Narrative   From [place] -    Social Determinants of Health   Financial Resource Strain:   . Difficulty of Paying Living Expenses:   Food Insecurity:   . Worried About Charity fundraiser in the Last Year:   . Arboriculturist in the Last Year:   Transportation Needs:   . Film/video editor (Medical):   Marland Kitchen Lack of Transportation (Non-Medical):   Physical Activity:   . Days of Exercise per Week:   . Minutes of Exercise per Session:   Stress:   . Feeling of Stress :   Social Connections:   . Frequency of Communication with Friends and Family:   . Frequency of Social Gatherings with Friends and  Family:   . Attends Religious Services:   . Active Member of Clubs or Organizations:   . Attends Archivist Meetings:   Marland Kitchen Marital Status:   Intimate Partner Violence:   . Fear of Current or Ex-Partner:   . Emotionally Abused:   Marland Kitchen Physically Abused:   . Sexually Abused:     No outpatient medications prior to visit.   No facility-administered medications prior to visit.    ROS Review of Systems  Constitutional: Negative for fever, malaise/fatigue and weight loss.  HENT: Negative for congestion and sore throat.   Eyes:       Negative for visual changes  Respiratory: Negative for cough and shortness of breath.   Cardiovascular: Negative for chest pain, palpitations and leg swelling.  Gastrointestinal: Negative for blood in stool, constipation, diarrhea and heartburn.  Genitourinary: Negative for dysuria, frequency and urgency.  Musculoskeletal: Negative for falls, joint pain and myalgias.  Skin: Negative for rash.  Neurological: Negative for dizziness, sensory change and headaches.  Endo/Heme/Allergies: Does not bruise/bleed easily.  Psychiatric/Behavioral: Negative for depression, substance abuse and suicidal ideas. The patient is not nervous/anxious.     Objective:  BP 124/64   Pulse 75   Temp (!) 96.4 F (35.8 C) (Tympanic)   Ht 5' 0.63" (1.54 m)   Wt 127 lb 9.6 oz (57.9 kg)  SpO2 98%   BMI 24.40 kg/m   BP Readings from Last 3 Encounters:  12/16/19 124/64  10/22/19 132/76  04/09/19 110/72    Wt Readings from Last 3 Encounters:  12/16/19 127 lb 9.6 oz (57.9 kg)  10/22/19 125 lb (56.7 kg)  08/03/16 141 lb 9.6 oz (64.2 kg)    Physical Exam Vitals reviewed. Exam conducted with a chaperone present.  Eyes:     Extraocular Movements: Extraocular movements intact.     Conjunctiva/sclera: Conjunctivae normal.  Neck:     Thyroid: Thyromegaly present. No thyroid mass or thyroid tenderness.  Cardiovascular:     Rate and Rhythm: Normal rate and regular  rhythm.     Pulses: Normal pulses.     Heart sounds: Normal heart sounds.  Pulmonary:     Effort: Pulmonary effort is normal.     Breath sounds: Normal breath sounds.  Chest:     Chest wall: No mass.     Breasts:        Right: Mass present. No swelling, bleeding, inverted nipple, nipple discharge, skin change or tenderness.        Left: Normal.    Abdominal:     General: Bowel sounds are normal.     Palpations: Abdomen is soft.     Hernia: There is no hernia in the right inguinal area.  Genitourinary:    General: Normal vulva.     Labia:        Right: No rash or tenderness.        Left: No rash or tenderness.      Urethra: Prolapse present. No urethral pain, urethral swelling or urethral lesion.     Vagina: Normal. No vaginal discharge.     Cervix: Normal.     Adnexa: Right adnexa normal and left adnexa normal.     Comments: atrophic vaginal walls. Musculoskeletal:     Cervical back: Normal range of motion and neck supple.     Right lower leg: No edema.     Left lower leg: No edema.  Lymphadenopathy:     Cervical: No cervical adenopathy.     Upper Body:     Right upper body: No supraclavicular, axillary or pectoral adenopathy.     Left upper body: No supraclavicular, axillary or pectoral adenopathy.     Lower Body: No right inguinal adenopathy.  Skin:    General: Skin is warm and dry.  Neurological:     Mental Status: Kathleen Lawrence is alert and oriented to person, place, and time.  Psychiatric:        Mood and Affect: Mood normal.        Behavior: Behavior normal.        Thought Content: Thought content normal.     Lab Results  Component Value Date   WBC 5.1 10/29/2019   HGB 11.9 10/29/2019   HCT 35.9 10/29/2019   PLT 195 10/29/2019   GLUCOSE 102 (H) 10/29/2019   CHOL 178 10/29/2019   TRIG 60 10/29/2019   HDL 70 10/29/2019   LDLCALC 93 10/29/2019   ALT 14 10/29/2019   AST 27 10/29/2019   NA 143 10/29/2019   K 4.4 10/29/2019   CL 106 10/29/2019   CREATININE 0.64  10/29/2019   BUN 12 10/29/2019   CO2 27 10/29/2019    US Soft Tissue Head/Neck  Result Date: 11/20/2013 CLINICAL DATA:  Thyromegaly. EXAM: THYROID ULTRASOUND TECHNIQUE: Ultrasound examination of the thyroid gland and adjacent soft tissues was performed. COMPARISON:  None.  FINDINGS: Right thyroid lobe Measurements: 5.6 x 3.5 x 2.9 cm.  No nodules visualized. Left thyroid lobe Measurements: 7.7 x 3.7 x 4.3 cm.  No nodules visualized. Isthmus Thickness: 2.0 cm.  No nodules visualized. The thyroid gland is diffusely and moderately enlarged. Thyroid echotexture is extremely heterogeneous vaguely multinodular in appearance without evidence of discrete measurable thyroid nodule. Several small cystic areas are identified in both lobes which are all well under 1 cm in diameter. Lymphadenopathy None visualized. IMPRESSION: Moderate thyromegaly with heterogeneous and diffusely nodular gland consistent with multinodular goiter. The nodularity is very vague and heterogeneous with no discretely marginated measurable nodules identified. Electronically Signed   By: Aletta Edouard M.D.   On: 11/20/2013 16:47    Assessment & Plan:  This visit occurred during the SARS-CoV-2 public health emergency.  Safety protocols were in place, including screening questions prior to the visit, additional usage of staff PPE, and extensive cleaning of exam room while observing appropriate contact time as indicated for disinfecting solutions.   Kathleen Lawrence was seen today for establish care.  Diagnoses and all orders for this visit:  Vitamin D deficiency -     Vitamin D, Ergocalciferol, (DRISDOL) 1.25 MG (50000 UNIT) CAPS capsule; Take 1 capsule (50,000 Units total) by mouth every 7 (seven) days. -     Vitamin D 1,25 dihydroxy; Future  Mass of axillary tail of right breast -     MM Digital Diagnostic Unilat R; Future -     MM Digital Diagnostic Bilat; Future -     US BREAST LTD UNI RIGHT INC AXILLA; Future  Encounter for  Papanicolaou smear for cervical cancer screening -     Cytology - PAP( [place])  Colon cancer screening -     Ambulatory referral to Gastroenterology  Encounter for hepatitis C screening test for low risk patient -     Hepatitis C Antibody; Future  Thyromegaly -     TSH; Future -     T3, free; Future -     T4, free; Future   I am having Kathleen Lawrence start on Vitamin D (Ergocalciferol).  Meds ordered this encounter  Medications  . Vitamin D, Ergocalciferol, (DRISDOL) 1.25 MG (50000 UNIT) CAPS capsule    Sig: Take 1 capsule (50,000 Units total) by mouth every 7 (seven) days.    Dispense:  12 capsule    Refill:  0    Order Specific Question:   Supervising Provider    Answer:   Ronnald Nian [id number]    Problem List Items Addressed This Visit      Endocrine   Thyromegaly    [place] completed 11/2013: multinodular goiter, no discrete nodule. Ordered repeat thyroid panel today      Relevant Orders   TSH   T3, free   T4, free     Other   Vitamin D deficiency - Primary    Vit D level at 11 Start 50,000IU weekly Repeat lab in 38months      Relevant Medications   Vitamin D, Ergocalciferol, (DRISDOL) 1.25 MG (50000 UNIT) CAPS capsule   Other Relevant Orders   Vitamin D 1,25 dihydroxy    Other Visit Diagnoses    Mass of axillary tail of right breast       Relevant Orders   MM Digital Diagnostic Unilat R   MM Digital Diagnostic Bilat   US BREAST LTD UNI RIGHT INC AXILLA   Encounter for Papanicolaou smear for cervical cancer screening  Relevant Orders   Cytology - PAP( [place])   Colon cancer screening       Relevant Orders   Ambulatory referral to Gastroenterology   Encounter for hepatitis C screening test for low risk patient       Relevant Orders   Hepatitis C Antibody       Follow-up: Return in about 1 year (around 12/15/2020) for CPE (fasting).  Wilfred Lacy, NP

## 2019-12-16 NOTE — Assessment & Plan Note (Signed)
Vit D level at 11 Start 50,000IU weekly Repeat lab in 57months

## 2019-12-16 NOTE — Assessment & Plan Note (Addendum)
[place] completed 11/2013: multinodular goiter, no discrete nodule. Ordered repeat thyroid panel today

## 2019-12-17 LAB — CYTOLOGY - PAP
Comment: NEGATIVE
Diagnosis: NEGATIVE
High risk HPV: NEGATIVE

## 2019-12-21 ENCOUNTER — Telehealth: Payer: Self-pay | Admitting: Nurse Practitioner

## 2019-12-21 NOTE — Telephone Encounter (Signed)
Patient is returning the call. CB is [phone]

## 2019-12-22 NOTE — Telephone Encounter (Signed)
Pt called back and asked to have Charlotte's nurse call back when able

## 2019-12-22 NOTE — Telephone Encounter (Signed)
Notified patient of lab results.  Patient verbalized understanding.  

## 2019-12-28 ENCOUNTER — Ambulatory Visit
Admission: RE | Admit: 2019-12-28 | Discharge: 2019-12-28 | Disposition: A | Discharge status: Home / Self Care | Payer: PRIVATE HEALTH INSURANCE | Source: Ambulatory Visit | Attending: Nurse Practitioner | Admitting: Nurse Practitioner

## 2019-12-28 ENCOUNTER — Other Ambulatory Visit: Payer: Self-pay

## 2019-12-28 DIAGNOSIS — N6331 Unspecified lump in axillary tail of the right breast: Secondary | ICD-10-CM

## 2020-01-10 ENCOUNTER — Other Ambulatory Visit: Payer: Self-pay | Admitting: Nurse Practitioner

## 2020-01-10 DIAGNOSIS — E559 Vitamin D deficiency, unspecified: Secondary | ICD-10-CM

## 2020-01-20 ENCOUNTER — Other Ambulatory Visit: Payer: Self-pay

## 2020-01-20 ENCOUNTER — Ambulatory Visit (AMBULATORY_SURGERY_CENTER): Payer: Self-pay | Admitting: *Deleted

## 2020-01-20 VITALS — Ht 60.5 in | Wt 127.8 lb

## 2020-01-20 DIAGNOSIS — Z1211 Encounter for screening for malignant neoplasm of colon: Secondary | ICD-10-CM

## 2020-01-20 MED ORDER — SUTAB 1479-225-188 MG PO TABS: 1.0000 | ORAL_TABLET | Freq: Once | ORAL | 0 refills | Status: AC

## 2020-01-20 NOTE — Progress Notes (Signed)
2nd covid vaccine April 2021  Pt is aware that care partner will wait in the car during procedure; if they feel like they will be too hot or cold to wait in the car; they may wait in the 4 th floor lobby. Patient is aware to bring only one care partner. We want them to wear a mask (we do not have any that we can provide them), practice social distancing, and we will check their temperatures when they get here.  I did remind the patient that their care partner needs to stay in the parking lot the entire time and have a cell phone available, we will call them when the pt is ready for discharge. Patient will wear mask into building.   No trouble with anesthesia, difficulty with moving neck or hx/fam hx of malignant hyperthermia per pt   No egg or soy allergy  No home oxygen use   No medications for weight loss taken  Pt denies constipation issues   Sutab code put into RX and paper copy given to pt to show pharmacy

## 2020-01-26 ENCOUNTER — Encounter: Payer: Self-pay | Admitting: Gastroenterology

## 2020-02-03 ENCOUNTER — Encounter: Payer: Self-pay | Admitting: Gastroenterology

## 2020-02-03 ENCOUNTER — Ambulatory Visit (AMBULATORY_SURGERY_CENTER): Payer: PRIVATE HEALTH INSURANCE | Admitting: Gastroenterology

## 2020-02-03 ENCOUNTER — Other Ambulatory Visit: Payer: Self-pay

## 2020-02-03 VITALS — BP 142/52 | HR 58 | Temp 97.1°F | Resp 15 | Ht 60.5 in | Wt 127.8 lb

## 2020-02-03 DIAGNOSIS — D122 Benign neoplasm of ascending colon: Secondary | ICD-10-CM | POA: Diagnosis not present

## 2020-02-03 DIAGNOSIS — Z1211 Encounter for screening for malignant neoplasm of colon: Secondary | ICD-10-CM

## 2020-02-03 MED ORDER — SODIUM CHLORIDE 0.9 % IV SOLN: 500.0000 mL | Freq: Once | INTRAVENOUS | Status: DC

## 2020-02-03 NOTE — Op Note (Signed)
[facility] Patient Name: Kathleen Lawrence Procedure Date: 02/03/2020 11:27 AM MRN: [id number] Endoscopist: Mauri Pole , MD Age: 64 Referring MD:  Date of Birth: May 05, 1956 Gender: Female Account #: [id number] Procedure:                Colonoscopy Indications:              Screening for colorectal malignant neoplasm Medicines:                Monitored Anesthesia Care Procedure:                Pre-Anesthesia Assessment:                           - Prior to the procedure, a History and Physical                            was performed, and patient medications and                            allergies were reviewed. The patient's tolerance of                            previous anesthesia was also reviewed. The risks                            and benefits of the procedure and the sedation                            options and risks were discussed with the patient.                            All questions were answered, and informed consent                            was obtained. Prior Anticoagulants: The patient has                            taken no previous anticoagulant or antiplatelet                            agents. ASA Grade Assessment: II - A patient with                            mild systemic disease. After reviewing the risks                            and benefits, the patient was deemed in                            satisfactory condition to undergo the procedure.                           After obtaining informed consent, the colonoscope  was passed under direct vision. Throughout the                            procedure, the patient's blood pressure, pulse, and                            oxygen saturations were monitored continuously. The                            Colonoscope was introduced through the anus and                            advanced to the the cecum, identified by                            appendiceal orifice  and ileocecal valve. The                            colonoscopy was performed without difficulty. The                            patient tolerated the procedure well. The quality                            of the bowel preparation was adequate. The                            ileocecal valve, appendiceal orifice, and rectum                            were photographed. Scope In: 11:34:04 AM Scope Out: 11:55:44 AM Scope Withdrawal Time: 0 hours 12 minutes 1 second  Total Procedure Duration: 0 hours 21 minutes 40 seconds  Findings:                 The perianal and digital rectal examinations were                            normal.                           Two sessile polyps were found in the ascending                            colon. The polyps were 1 to 2 mm in size. These                            polyps were removed with a cold biopsy forceps.                            Resection and retrieval were complete.                           Non-bleeding internal hemorrhoids were found during  retroflexion. The hemorrhoids were small.                           The exam was otherwise without abnormality. Complications:            No immediate complications. Estimated Blood Loss:     Estimated blood loss was minimal. Impression:               - Two 1 to 2 mm polyps in the ascending colon,                            removed with a cold biopsy forceps. Resected and                            retrieved.                           - Non-bleeding internal hemorrhoids.                           - The examination was otherwise normal. Recommendation:           - Patient has a contact number available for                            emergencies. The signs and symptoms of potential                            delayed complications were discussed with the                            patient. Return to normal activities tomorrow.                            Written discharge  instructions were provided to the                            patient.                           - Resume previous diet.                           - Continue present medications.                           - Await pathology results.                           - Repeat colonoscopy in 5 years for surveillance                            based on pathology results. Mauri Pole, MD 02/03/2020 12:17:46 PM This report has been signed electronically.

## 2020-02-03 NOTE — Progress Notes (Signed)
Called to room to assist during endoscopic procedure.  Patient ID and intended procedure confirmed with present staff. Received instructions for my participation in the procedure from the performing physician.  

## 2020-02-03 NOTE — Patient Instructions (Signed)
Handouts given :  Hemorrhoids,Polyps Resume previous diet  continue present medications  await pathology results  YOU HAD AN ENDOSCOPIC PROCEDURE TODAY AT [facility]:   Refer to the procedure report that was given to you for any specific questions about what was found during the examination.  If the procedure report does not answer your questions, please call your gastroenterologist to clarify.  If you requested that your care partner not be given the details of your procedure findings, then the procedure report has been included in a sealed envelope for you to review at your convenience later.  YOU SHOULD EXPECT: Some feelings of bloating in the abdomen. Passage of more gas than usual.  Walking can help get rid of the air that was put into your GI tract during the procedure and reduce the bloating. If you had a lower endoscopy (such as a colonoscopy or flexible sigmoidoscopy) you may notice spotting of blood in your stool or on the toilet paper. If you underwent a bowel prep for your procedure, you may not have a normal bowel movement for a few days.  Please Note:  You might notice some irritation and congestion in your nose or some drainage.  This is from the oxygen used during your procedure.  There is no need for concern and it should clear up in a day or so.  SYMPTOMS TO REPORT IMMEDIATELY:   Following lower endoscopy (colonoscopy or flexible sigmoidoscopy):  Excessive amounts of blood in the stool  Significant tenderness or worsening of abdominal pains  Swelling of the abdomen that is new, acute  Fever of 100F or higher    For urgent or emergent issues, a gastroenterologist can be reached at any hour by calling [phone]. Do not use MyChart messaging for urgent concerns.    DIET:  We do recommend a small meal at first, but then you may proceed to your regular diet.  Drink plenty of fluids but you should avoid alcoholic beverages for 24 hours.  ACTIVITY:  You  should plan to take it easy for the rest of today and you should NOT DRIVE or use heavy machinery until tomorrow (because of the sedation medicines used during the test).    FOLLOW UP: Our staff will call the number listed on your records 48-72 hours following your procedure to check on you and address any questions or concerns that you may have regarding the information given to you following your procedure. If we do not reach you, we will leave a message.  We will attempt to reach you two times.  During this call, we will ask if you have developed any symptoms of COVID 19. If you develop any symptoms (ie: fever, flu-like symptoms, shortness of breath, cough etc.) before then, please call [phone].  If you test positive for Covid 19 in the 2 weeks post procedure, please call and report this information to [place].    If any biopsies were taken you will be contacted by phone or by letter within the next 1-3 weeks.  Please call us at [phone] if you have not heard about the biopsies in 3 weeks.    SIGNATURES/CONFIDENTIALITY: You and/or your care partner have signed paperwork which will be entered into your electronic medical record.  These signatures attest to the fact that that the information above on your After Visit Summary has been reviewed and is understood.  Full responsibility of the confidentiality of this discharge information lies with you and/or your care-partner.

## 2020-02-03 NOTE — Progress Notes (Signed)
VS by CW  Pt's states no medical or surgical changes since previsit or office visit.  

## 2020-02-05 ENCOUNTER — Telehealth: Payer: Self-pay

## 2020-02-05 ENCOUNTER — Telehealth: Payer: Self-pay | Admitting: *Deleted

## 2020-02-05 NOTE — Telephone Encounter (Signed)
Follow up call made, left message. 

## 2020-02-05 NOTE — Telephone Encounter (Signed)
Left message on follow up call. 

## 2020-02-09 ENCOUNTER — Encounter: Payer: Self-pay | Admitting: Gastroenterology

## 2020-03-03 ENCOUNTER — Telehealth: Payer: Self-pay | Admitting: Nurse Practitioner

## 2020-03-03 NOTE — Telephone Encounter (Signed)
Patient called to make appt, stated she has body aches, headache and earache. I offered her a virtual appt today with her provider but she said she would rather go to an urgent care facility and be seen in person.

## 2020-03-10 ENCOUNTER — Other Ambulatory Visit: Payer: Self-pay | Admitting: Family Medicine

## 2020-03-10 LAB — T3, FREE: T3, Free: 3.2 pg/mL (ref 2.3–4.2)

## 2020-03-10 LAB — VITAMIN D 25 HYDROXY (VIT D DEFICIENCY, FRACTURES): Vit D, 25-Hydroxy: 61 ng/mL (ref 30–100)

## 2020-03-10 LAB — TSH+FREE T4
Free T4: 1.1 ng/dL (ref 0.8–1.8)
TSH: 0.23 mIU/L — ABNORMAL LOW (ref 0.40–4.50)

## 2020-03-11 ENCOUNTER — Telehealth: Payer: Self-pay | Admitting: Family Medicine

## 2020-03-11 NOTE — Telephone Encounter (Signed)
LVM for pt to return call to discuss her lab results.

## 2020-03-17 ENCOUNTER — Telehealth: Payer: Self-pay | Admitting: Nurse Practitioner

## 2020-03-17 NOTE — Telephone Encounter (Signed)
Patient call is requesting a call back regarding medication, please advise. CB is [phone]

## 2020-03-17 NOTE — Telephone Encounter (Signed)
Pt states she had labs done on 03/10/20 and would like to know if she should continue on her Vitamin D? Please advise

## 2020-03-18 NOTE — Telephone Encounter (Signed)
Informed patient via voicemail and instructed her to call with any questions or concerns.

## 2020-03-18 NOTE — Telephone Encounter (Signed)
Switch to vitamin D 5000IU daily over the counter

## 2020-10-20 ENCOUNTER — Encounter: Payer: Self-pay | Admitting: Family Medicine

## 2020-10-20 ENCOUNTER — Ambulatory Visit: Payer: PRIVATE HEALTH INSURANCE | Admitting: Family Medicine

## 2020-10-20 VITALS — BP 130/76 | HR 72 | Ht 62.0 in | Wt 128.0 lb

## 2020-10-20 DIAGNOSIS — Z789 Other specified health status: Secondary | ICD-10-CM

## 2020-10-20 NOTE — Progress Notes (Signed)
  Subjective:     Patient ID: Kathleen Lawrence, female   DOB: 10/13/1955, 65 y.o.   MRN: [id number]  HPI  Kathleen Lawrence presents to the employee health and wellness clinic today for her required wellness visit for her insurance. She is doing well today with no concerns. She got established with a new PCP last year, Wilfred Lacy, NP, and will see again in June of this year. She is UTD on her mammogram, pap smear, and colonoscopy.   Past Medical History:  Diagnosis Date  . Arthritis    fingers   Allergies  Allergen Reactions  . Clopidogrel Bisulfate     REACTION: unspecified- "funny feeling"    Current Outpatient Medications:  .  cholecalciferol (VITAMIN D3) 25 MCG (1000 UNIT) tablet, Take 2,000 Units by mouth daily., Disp: , Rfl:  .  Vitamin D, Ergocalciferol, (DRISDOL) 1.25 MG (50000 UNIT) CAPS capsule, Take 1 capsule (50,000 Units total) by mouth every 7 (seven) days. (Patient not taking: Reported on 10/20/2020), Disp: 12 capsule, Rfl: 0   Review of Systems  Constitutional: Negative for chills, fatigue, fever and unexpected weight change.  HENT: Negative for congestion, ear pain, sinus pressure, sinus pain and sore throat.   Eyes: Negative for discharge and visual disturbance.  Respiratory: Negative for cough, shortness of breath and wheezing.   Cardiovascular: Negative for chest pain and leg swelling.  Gastrointestinal: Negative for abdominal pain, blood in stool, constipation, diarrhea, nausea and vomiting.  Genitourinary: Negative for difficulty urinating and hematuria.  Skin: Negative for color change.  Neurological: Negative for dizziness, weakness, light-headedness and headaches.  Hematological: Negative for adenopathy.  All other systems reviewed and are negative.      Objective:   Physical Exam Vitals reviewed.  Constitutional:      General: She is not in acute distress.    Appearance: Normal appearance. She is well-developed.  HENT:     Head: Normocephalic and  atraumatic.  Eyes:     General:        Right eye: No discharge.        Left eye: No discharge.  Cardiovascular:     Rate and Rhythm: Normal rate and regular rhythm.     Heart sounds: Normal heart sounds.  Pulmonary:     Effort: Pulmonary effort is normal. No respiratory distress.     Breath sounds: Normal breath sounds.  Musculoskeletal:     Cervical back: Neck supple.  Skin:    General: Skin is warm and dry.  Neurological:     Mental Status: She is alert and oriented to person, place, and time.  Psychiatric:        Mood and Affect: Mood normal.        Behavior: Behavior normal.    Today's Vitals   10/20/20 1410  BP: 130/76  Pulse: 72  SpO2: 98%  Weight: 128 lb (58.1 kg)  Height: 5\' 2"  (1.575 m)   Body mass index is 23.41 kg/m.     Assessment:     Participant in health and wellness plan      Plan:     1. Keep all f/u appt with PCP for yearly physical and lab work. Continue efforts at healthy eating and increasing physical activity. F/u here prn.

## 2020-12-19 ENCOUNTER — Encounter: Payer: PRIVATE HEALTH INSURANCE | Admitting: Nurse Practitioner

## 2021-04-11 ENCOUNTER — Other Ambulatory Visit: Payer: Self-pay | Admitting: Nurse Practitioner

## 2021-04-11 DIAGNOSIS — Z1231 Encounter for screening mammogram for malignant neoplasm of breast: Secondary | ICD-10-CM

## 2021-05-05 ENCOUNTER — Ambulatory Visit
Admission: RE | Admit: 2021-05-05 | Discharge: 2021-05-05 | Disposition: A | Discharge status: Home / Self Care | Payer: PRIVATE HEALTH INSURANCE | Source: Ambulatory Visit | Attending: Nurse Practitioner | Admitting: Nurse Practitioner

## 2021-05-05 ENCOUNTER — Other Ambulatory Visit: Payer: Self-pay

## 2021-05-05 DIAGNOSIS — Z1231 Encounter for screening mammogram for malignant neoplasm of breast: Secondary | ICD-10-CM

## 2021-07-24 ENCOUNTER — Encounter: Payer: Self-pay | Admitting: Nurse Practitioner

## 2022-03-14 IMAGING — MG MM DIGITAL SCREENING BILAT W/ TOMO AND CAD
6 of 10 series · 6 of 30 positions shown · non-contrast
Comparison: Previous exam(s).

CLINICAL DATA: Screening.

EXAM:
DIGITAL SCREENING BILATERAL MAMMOGRAM WITH TOMOSYNTHESIS AND CAD
TECHNIQUE: Bilateral screening digital craniocaudal and mediolateral oblique
mammograms were obtained. Bilateral screening digital breast
tomosynthesis was performed. The images were evaluated with
computer-aided detection.

[L MLO synth-2D (1 of 2)]
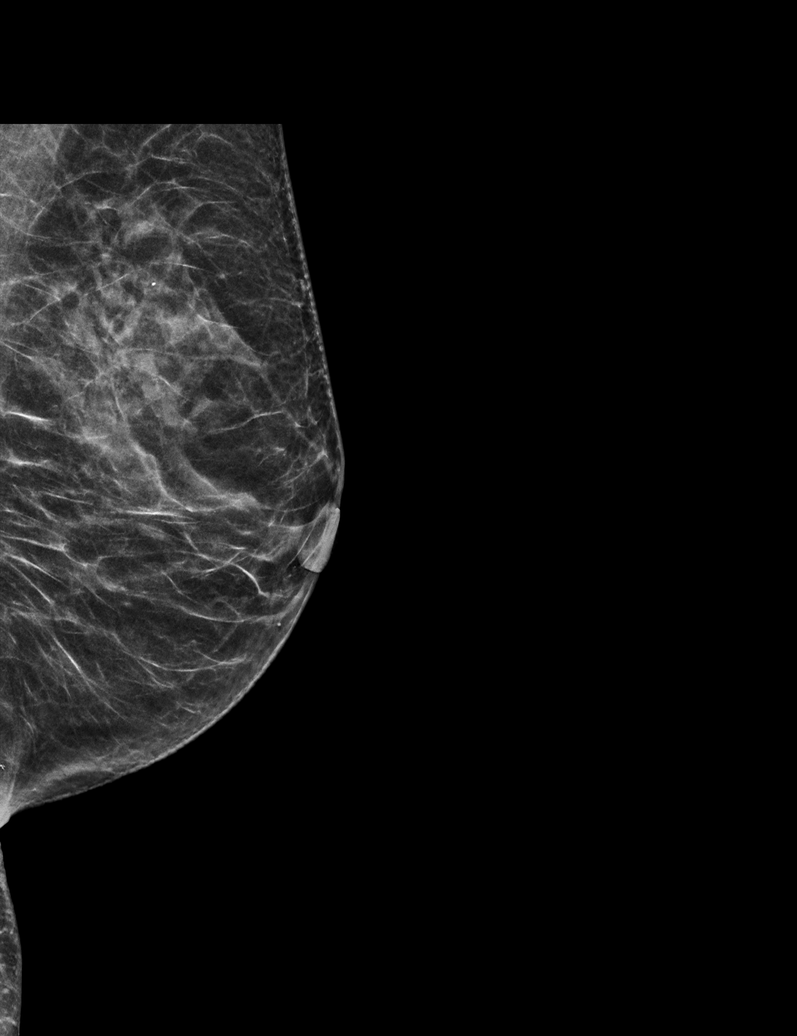

[L MLO synth-2D (2 of 2)]
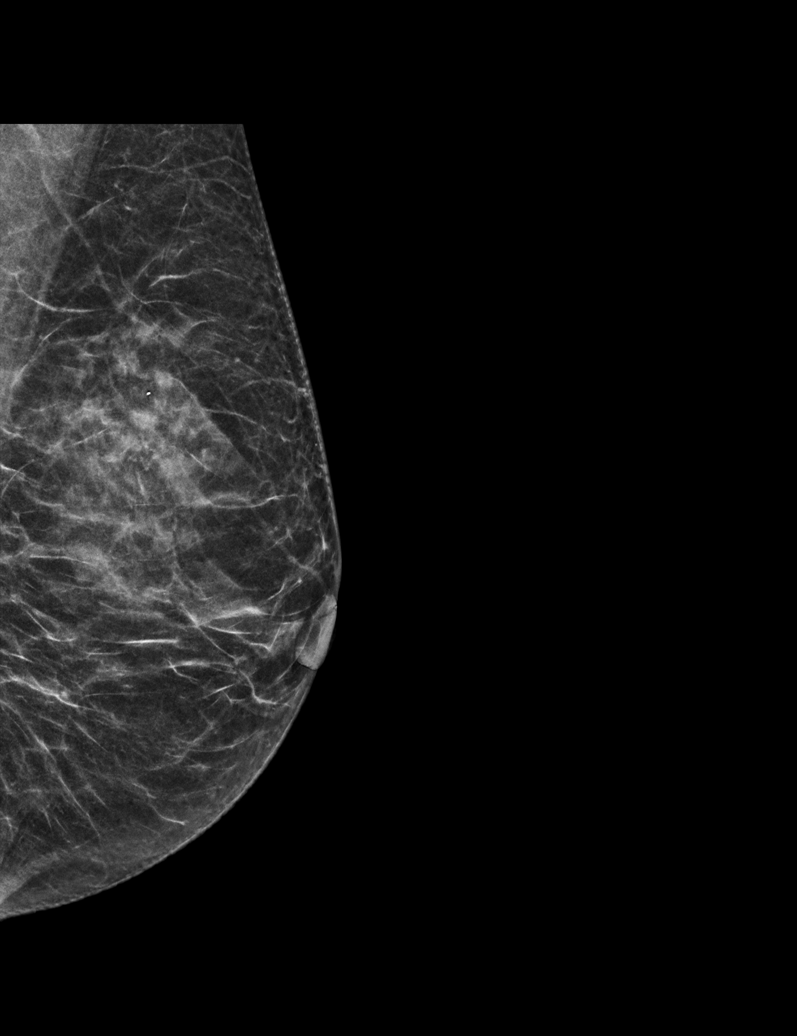

[L CC synth-2D]
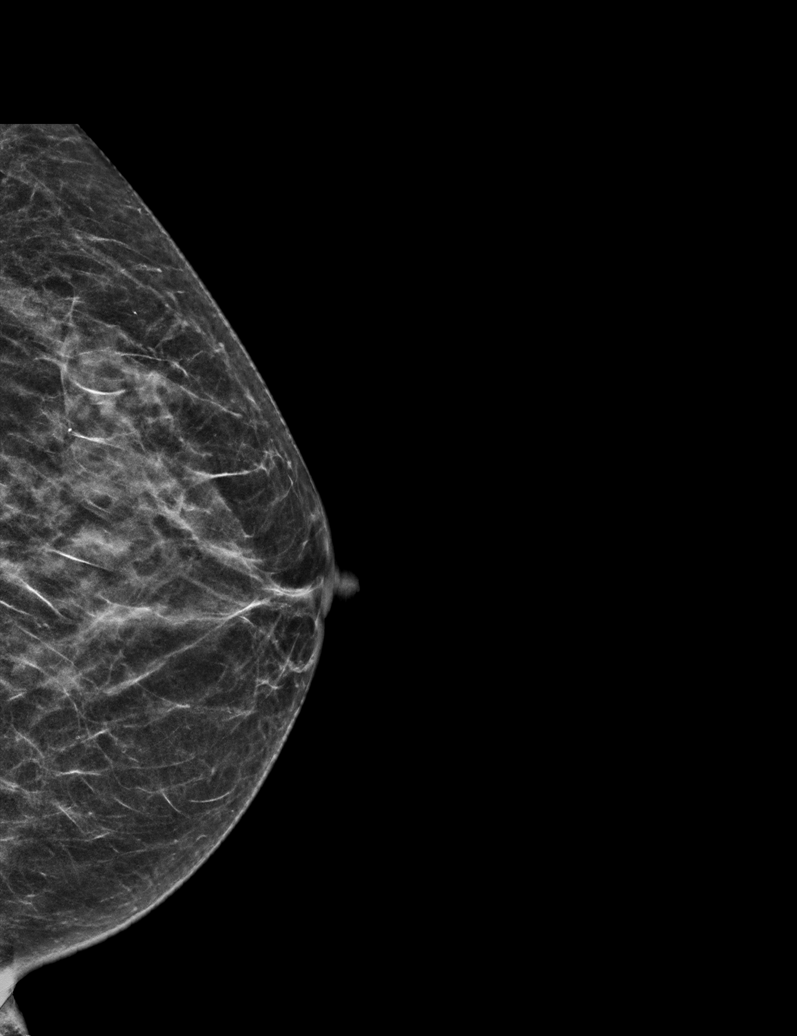

[R CC synth-2D]
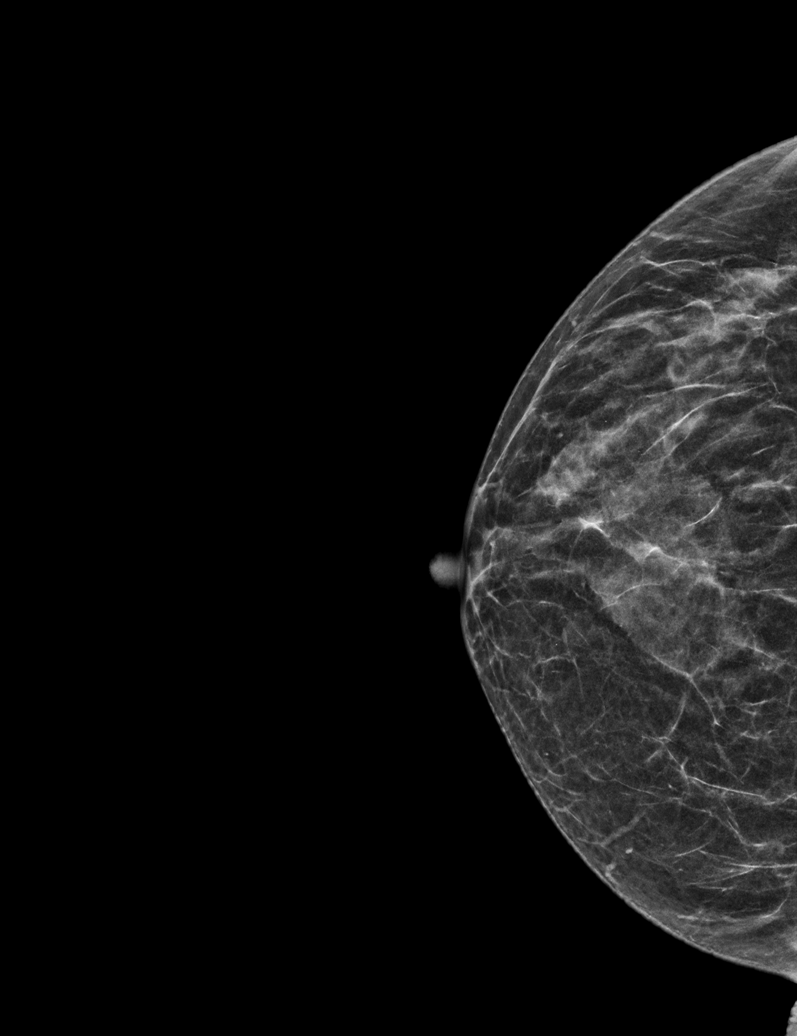

[R MLO synth-2D]
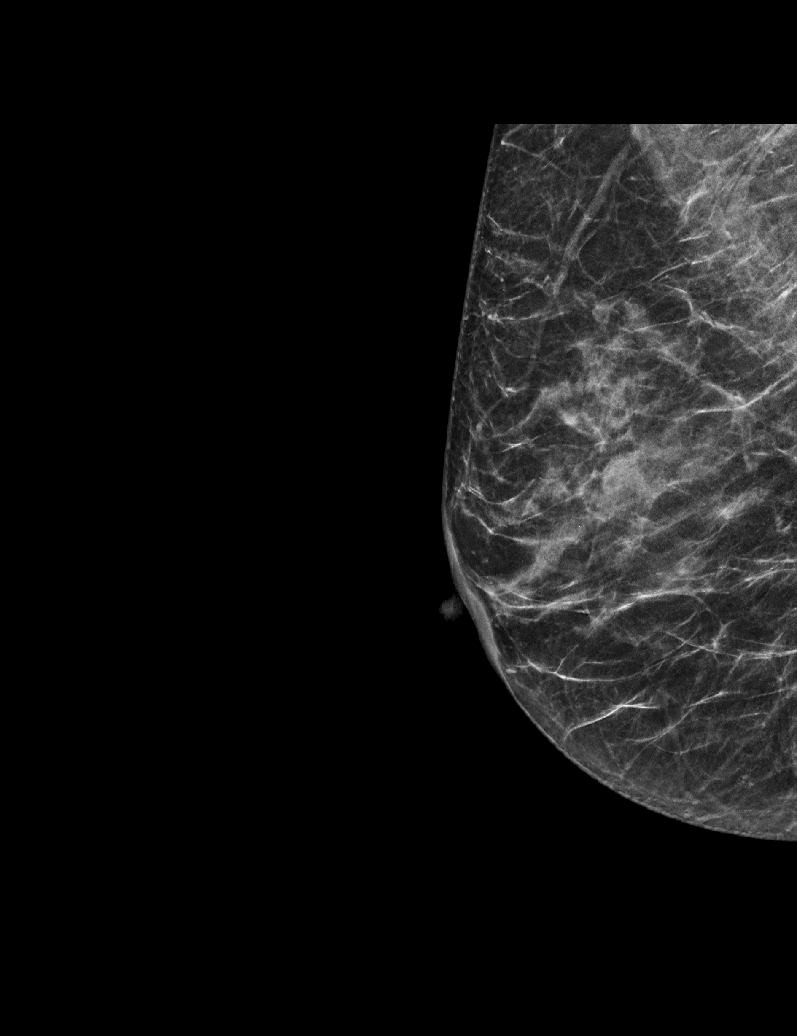

[L MLO tomo · tomo slice 25/49.0]
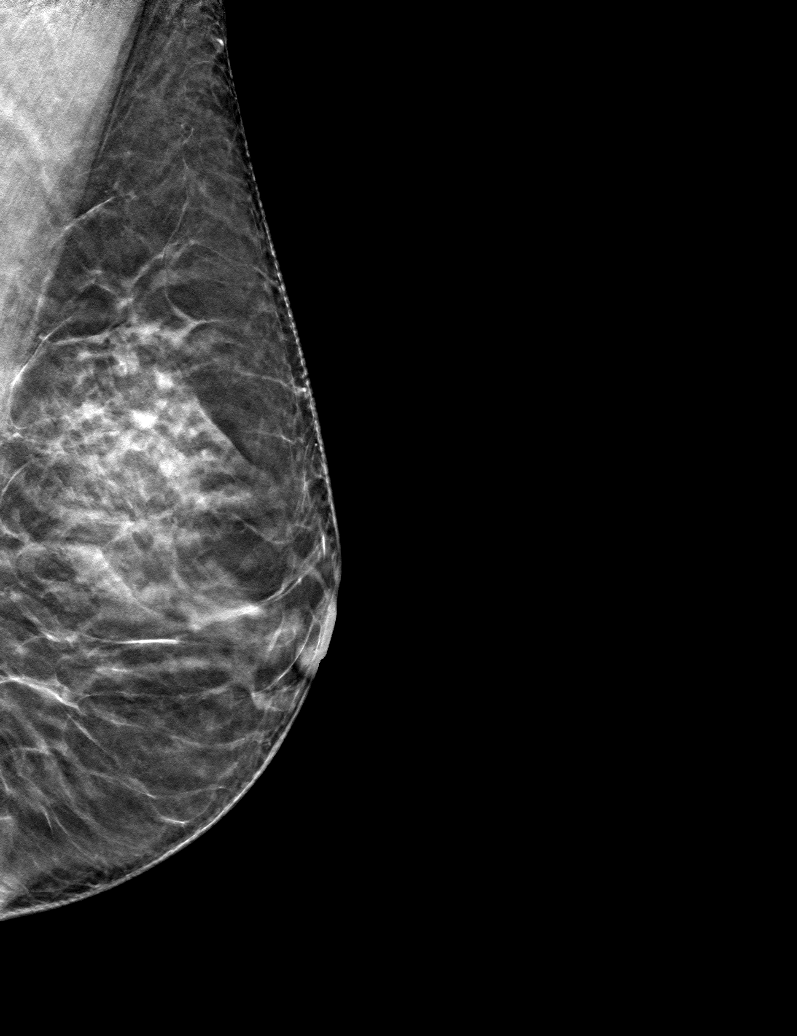

[6 of 30 positions shown; findings below may reference images not displayed]

ACR Breast Density Category c: The breast tissue is heterogeneously
dense, which may obscure small masses.
FINDINGS: There are no findings suspicious for malignancy.
IMPRESSION: No mammographic evidence of malignancy. A result letter of this
screening mammogram will be mailed directly to the patient.

RECOMMENDATION:
Screening mammogram in one year. (Code:Q3-W-BC3)

BI-RADS CATEGORY  1: Negative.

## 2022-04-13 ENCOUNTER — Encounter: Payer: Self-pay | Admitting: Family

## 2022-04-13 ENCOUNTER — Ambulatory Visit: Payer: PRIVATE HEALTH INSURANCE | Admitting: Family

## 2022-04-13 VITALS — BP 136/78 | HR 69 | Temp 98.5°F | Resp 16 | Ht 60.0 in | Wt 130.0 lb

## 2022-04-13 DIAGNOSIS — Z789 Other specified health status: Secondary | ICD-10-CM

## 2022-04-13 NOTE — Progress Notes (Signed)
  Subjective:     Patient ID: Kathleen Lawrence, female   DOB: 09-Feb-1956, 66 y.o.   MRN: [id number]  HPI  Kathleen Lawrence presents to the employee health and wellness clinic today for her required wellness visit for her insurance. She is doing well today with no concerns. She is UTD on her mammogram, and colonoscopy.   Past Medical History:  Diagnosis Date   Arthritis    fingers   Allergies  Allergen Reactions   Clopidogrel Bisulfate     REACTION: unspecified- "funny feeling"     Review of Systems  All other systems reviewed and are negative.      Objective:   Physical Exam Vitals reviewed.  Constitutional:      Appearance: Normal appearance. She is well-developed and normal weight.  HENT:     Head: Normocephalic and atraumatic.  Eyes:     Pupils: Pupils are equal, round, and reactive to light.  Cardiovascular:     Rate and Rhythm: Normal rate and regular rhythm.     Heart sounds: Normal heart sounds.  Pulmonary:     Effort: Pulmonary effort is normal.     Breath sounds: Normal breath sounds.  Musculoskeletal:     Cervical back: Neck supple.  Skin:    General: Skin is warm and dry.  Neurological:     Mental Status: She is alert and oriented to person, place, and time.  Psychiatric:        Mood and Affect: Mood normal.        Behavior: Behavior normal.    Today's Vitals   04/13/22 2128  BP: 136/78  Pulse: 69  Resp: 16  Temp: 98.5 F (36.9 C)  SpO2: 96%  Weight: 130 lb (59 kg)  Height: 5' (1.524 m)   Body mass index is 25.39 kg/m.     Assessment:     Participant in health and wellness plan      Plan:     1. Needs to find a PCP for yearly physical and lab work. Will come to Employee clinic while fasting for annual labs.  Is due for her Shingles vaccine- will also get through employee clinic.   2.Continue efforts at healthy eating and increasing physical activity.   3.F/u here prn.

## 2022-10-08 ENCOUNTER — Other Ambulatory Visit: Payer: Self-pay | Admitting: Nurse Practitioner

## 2022-10-08 DIAGNOSIS — Z1231 Encounter for screening mammogram for malignant neoplasm of breast: Secondary | ICD-10-CM

## 2022-11-13 ENCOUNTER — Other Ambulatory Visit: Payer: Self-pay | Admitting: Family Medicine

## 2022-11-13 ENCOUNTER — Other Ambulatory Visit (HOSPITAL_COMMUNITY)
Admission: RE | Admit: 2022-11-13 | Discharge: 2022-11-13 | Disposition: A | Discharge status: Home / Self Care | Payer: PRIVATE HEALTH INSURANCE | Source: Ambulatory Visit | Attending: Family Medicine | Admitting: Family Medicine

## 2022-11-13 DIAGNOSIS — Z01411 Encounter for gynecological examination (general) (routine) with abnormal findings: Secondary | ICD-10-CM | POA: Diagnosis present

## 2022-11-15 LAB — CYTOLOGY - PAP: Diagnosis: NEGATIVE

## 2022-11-16 ENCOUNTER — Other Ambulatory Visit: Payer: Self-pay | Admitting: Family Medicine

## 2022-11-16 DIAGNOSIS — E2839 Other primary ovarian failure: Secondary | ICD-10-CM

## 2022-11-16 LAB — CYTOLOGY - PAP
Comment: NEGATIVE
Diagnosis: NEGATIVE
High risk HPV: NEGATIVE

## 2022-11-20 ENCOUNTER — Ambulatory Visit
Admission: RE | Admit: 2022-11-20 | Discharge: 2022-11-20 | Disposition: A | Discharge status: Home / Self Care | Payer: PRIVATE HEALTH INSURANCE | Source: Ambulatory Visit | Attending: Nurse Practitioner | Admitting: Nurse Practitioner

## 2022-11-20 DIAGNOSIS — Z1231 Encounter for screening mammogram for malignant neoplasm of breast: Secondary | ICD-10-CM

## 2023-04-11 ENCOUNTER — Ambulatory Visit: Payer: PRIVATE HEALTH INSURANCE | Admitting: Nurse Practitioner

## 2023-04-11 ENCOUNTER — Encounter: Payer: Self-pay | Admitting: Nurse Practitioner

## 2023-04-11 VITALS — BP 151/82 | HR 75

## 2023-04-11 DIAGNOSIS — Z789 Other specified health status: Secondary | ICD-10-CM

## 2023-04-11 DIAGNOSIS — R030 Elevated blood-pressure reading, without diagnosis of hypertension: Secondary | ICD-10-CM

## 2023-04-11 NOTE — Progress Notes (Signed)
Open in error

## 2023-04-11 NOTE — Progress Notes (Signed)
[facility]- [facility]  Subjective:  Patient ID: Kathleen Lawrence, female    DOB: August 11, 1955  Age: 67 y.o. MRN: [id number]  CC: Wellness exam   HPI [name] presents for wellness exam visit for insurance benefit.  Patient has a PCP: Jorge Ny NP PMH significant for: Vit d def Last labs per PCP were completed: May 2024  Health Maintenance:  Colonoscopy: 2021, repeat in 7 years.  Mammogram: May 2024 Pap: 2021    Smoker: Never  Immunizations:  Shingrix-  never done, would like  COVID-  x4 Tdap- up to date  Flu- yearly  Lifestyle: Diet- no particular  Exercise- no exercise.      Past Medical History:  Diagnosis Date   Arthritis    fingers    Past Surgical History:  Procedure Laterality Date   COLONOSCOPY     MYOMECTOMY      Outpatient Medications Prior to Visit  Medication Sig Dispense Refill   cholecalciferol (VITAMIN D3) 25 MCG (1000 UNIT) tablet Take 5,000 Units by mouth daily.     ibuprofen (ADVIL) 600 MG tablet Take 600 mg by mouth 3 (three) times daily as needed.     Vitamin D, Ergocalciferol, (DRISDOL) 1.25 MG (50000 UNIT) CAPS capsule Take 1 capsule (50,000 Units total) by mouth every 7 (seven) days. (Patient not taking: Reported on 10/20/2020) 12 capsule 0   No facility-administered medications prior to visit.    ROS Review of Systems  HENT:  Negative for hearing loss.   Eyes:  Negative for visual disturbance.  Respiratory:  Negative for shortness of breath.   Cardiovascular:  Negative for chest pain.  Gastrointestinal:  Negative for constipation, diarrhea, nausea and vomiting.  Musculoskeletal:  Negative for arthralgias and back pain.       None at rest  Neurological:  Negative for dizziness and headaches.    Objective:  BP (!) 151/82   Pulse 75   SpO2 97%   Physical Exam Constitutional:      General: She is not in acute distress. HENT:     Head: Normocephalic.     Right Ear: Tympanic membrane, ear canal and external  ear normal.     Left Ear: Tympanic membrane, ear canal and external ear normal.  Eyes:     Pupils: Pupils are equal, round, and reactive to light.  Cardiovascular:     Rate and Rhythm: Normal rate and regular rhythm.     Heart sounds: Normal heart sounds.  Pulmonary:     Effort: Pulmonary effort is normal.     Breath sounds: Normal breath sounds.  Musculoskeletal:        General: Normal range of motion.     Right lower leg: No edema.     Left lower leg: No edema.  Skin:    General: Skin is warm.  Neurological:     General: No focal deficit present.     Mental Status: She is alert and oriented to person, place, and time.  Psychiatric:        Mood and Affect: Mood normal.        Behavior: Behavior normal.      Assessment & Plan:    Mayce was seen today for wellness exam .  Diagnoses and all orders for this visit:  Participant in health and wellness plan Adult wellness physical was conducted today. Importance of diet and exercise were discussed in detail.  We reviewed immunizations and gave recommendations regarding current immunization needed for age. She  would like Shingrix, plan to administer at next visit.  Preventative health exams up to date  Patient was advised yearly wellness exam and follow-up with PCP as recommended.   Elevated blood pressure reading  BP is elevated today. Discussed BP goal less than 130/80. Instructed to check BP daily x 2 weeks and return for follow-up  No orders of the defined types were placed in this encounter.   No orders of the defined types were placed in this encounter.   Follow-up: in 2 weeks.

## 2023-05-02 ENCOUNTER — Ambulatory Visit: Payer: PRIVATE HEALTH INSURANCE | Admitting: Nurse Practitioner

## 2023-05-02 ENCOUNTER — Encounter: Payer: Self-pay | Admitting: Nurse Practitioner

## 2023-05-02 VITALS — BP 142/82 | HR 63 | Temp 97.9°F

## 2023-05-02 DIAGNOSIS — R030 Elevated blood-pressure reading, without diagnosis of hypertension: Secondary | ICD-10-CM

## 2023-05-02 DIAGNOSIS — Z23 Encounter for immunization: Secondary | ICD-10-CM

## 2023-05-02 NOTE — Progress Notes (Signed)
[facility]   Subjective   Patient ID: Kathleen Lawrence, female    DOB: March 28, 1956  Age: 67 y.o. MRN: [id number]  Chief Complaint  Patient presents with   BP follow-up    Patient presents today for a follow up blood pressure check. Presented home blood pressure log with the following values:  04/11/23: 124/74 04/12/23: 122/76 04/13/23: 128/63 9/31/24: 106/69  Patient states that blood pressure readings are normally high when she goes to the doctor, or when she gets anxious. Denies blurred vision or headaches.   Review of Systems  Eyes:  Negative for blurred vision.  Cardiovascular:  Negative for chest pain and palpitations.  Neurological:  Negative for headaches.      Objective:     BP (!) 142/82 (BP Location: Right Arm, Patient Position: Sitting)   Pulse 63   Temp 97.9 F (36.6 C) (Temporal)   SpO2 99%  Today's Vitals   05/02/23 1505 05/02/23 1508  BP: (!) 162/82 (!) 142/82  Pulse: 63   Temp: 97.9 F (36.6 C)   TempSrc: Temporal   SpO2: 99%     Physical Exam Constitutional:      Appearance: Normal appearance. She is not ill-appearing.  Cardiovascular:     Rate and Rhythm: Normal rate.     Pulses: Normal pulses.     Heart sounds: Normal heart sounds, S1 normal and S2 normal.  Pulmonary:     Effort: Pulmonary effort is normal.     Breath sounds: Normal breath sounds.  Neurological:     General: No focal deficit present.     Mental Status: She is alert. Mental status is at baseline.  Psychiatric:        Mood and Affect: Mood normal.        Behavior: Behavior normal.        Thought Content: Thought content normal.        Judgment: Judgment normal.      Assessment & Plan:   Problem List Items Addressed This Visit   None Visit Diagnoses     Immunization due    -  Primary 1st Dose of Shingrix given today. CDC VIS given prior to injection.  Tolerated well. No immediate adverse reactions noted. Educated and discussed red flag symptoms and to  call 911 or go to emergency room with signs of anaphylaxis.  Take OTC tylenol as needed for expected side effects such as muscle aches or fever.  Patient given the chance to ask all questions and discussed answers.  RTC in 2 months for 2nd dose of shingrix vaccine to complete series, or sooner as needed.     White coat syndrome without hypertension     Advised patient to continue monitoring blood pressure at home for a blood pressure goal of 130/80. Discussed symptoms of high blood pressure.           No follow-ups on file.    Gloris Ham, NP

## 2023-06-12 ENCOUNTER — Inpatient Hospital Stay: Admission: RE | Admit: 2023-06-12 | Payer: PRIVATE HEALTH INSURANCE | Source: Ambulatory Visit

## 2023-06-19 ENCOUNTER — Inpatient Hospital Stay
Admission: RE | Admit: 2023-06-19 | Discharge: 2023-06-19 | Disposition: A | Discharge status: Home / Self Care | Payer: PRIVATE HEALTH INSURANCE | Source: Ambulatory Visit | Attending: Family Medicine | Admitting: Family Medicine

## 2023-06-19 DIAGNOSIS — E2839 Other primary ovarian failure: Secondary | ICD-10-CM

## 2023-06-25 ENCOUNTER — Telehealth: Payer: Self-pay | Admitting: Nurse Practitioner

## 2023-06-25 NOTE — Telephone Encounter (Signed)
Called to remind second shingles vaccine is due after 07/02/23. Requested callback to schedule.

## 2023-10-31 ENCOUNTER — Other Ambulatory Visit: Payer: Self-pay | Admitting: Nurse Practitioner

## 2023-10-31 DIAGNOSIS — Z1231 Encounter for screening mammogram for malignant neoplasm of breast: Secondary | ICD-10-CM

## 2023-11-21 ENCOUNTER — Ambulatory Visit: Payer: PRIVATE HEALTH INSURANCE

## 2023-11-28 ENCOUNTER — Ambulatory Visit
Admission: RE | Admit: 2023-11-28 | Discharge: 2023-11-28 | Disposition: A | Discharge status: Home / Self Care | Payer: PRIVATE HEALTH INSURANCE | Source: Ambulatory Visit | Attending: Nurse Practitioner | Admitting: Nurse Practitioner

## 2023-11-28 DIAGNOSIS — Z1231 Encounter for screening mammogram for malignant neoplasm of breast: Secondary | ICD-10-CM

## 2023-12-25 ENCOUNTER — Ambulatory Visit: Payer: PRIVATE HEALTH INSURANCE | Attending: Family Medicine | Admitting: Audiologist

## 2023-12-25 DIAGNOSIS — H903 Sensorineural hearing loss, bilateral: Secondary | ICD-10-CM | POA: Insufficient documentation

## 2023-12-25 NOTE — Procedures (Signed)
  [facility]patient [facility] [address] [place], [place] [address] [phone]  AUDIOLOGICAL EVALUATION  Name: HUBERTA TOMPKINS    Status: Outpatient DOB: Dec 01, 1955    Referent: Bufford Carne, FNP MRN: [id number] Date: 12/25/2023     Diagnosis: Sensorineural hearing loss, bilateral   HISTORY: Kyesha, 68 y.o., was seen for an audiological evaluation.   Tiona notices increased difficulty hearing. She needs to be closer to people to hear them and missing things in noisy situations.  Carime notes no current ear pain, pressure, fullness, tinnitus, or dizziness.  Joline reports no history of noise exposure.   EVALUATION: Otoscopic inspection reveals clear ear canals with visible tympanic membranes.   Tympanometry was completed to assess middle ear status. Normal, Type A tympanograms were obtained bilaterally.  Standard audiometric techniques were used to obtain thresholds under high frequency headphones. Speech reception thresholds are 35 dBHL on the right and 35 dBHL on the left using recorded spondee word lists. Word recognition was 96% at 75 dBHL on the right and 88% at 75 dBHL on the left using recorded NU-6 word lists, in quiet.  A mild sloping to moderate sensorineural hearing loss is present bilaterally.   CONCLUSION:  Marsela has mild sloping to moderate sensorineural hearing loss bilaterally.  Word recognition is good in quiet at elevated conversational speech levels bilaterally. Findings were reviewed with the patient.   RECOMMENDATIONS: Repeat audiometric testing as medically indicated.  Use of binaural amplification to assist in daily listening situations. A list of area hearing aid providers was given to the patient. Use of communication strategies to improve communication in difficult listening situations.  Audiogram scanned in under media tab.  Burgess Caroline, Au.D., CCC-A Audiologist 12/25/2023  cc: Bufford Carne, FNP

## 2024-04-09 ENCOUNTER — Ambulatory Visit: Payer: PRIVATE HEALTH INSURANCE | Admitting: Nurse Practitioner

## 2024-04-14 ENCOUNTER — Encounter: Payer: Self-pay | Admitting: Nurse Practitioner

## 2024-04-14 ENCOUNTER — Ambulatory Visit: Payer: PRIVATE HEALTH INSURANCE | Admitting: Nurse Practitioner

## 2024-04-14 VITALS — BP 138/70 | HR 67 | Temp 97.1°F

## 2024-04-14 DIAGNOSIS — Z789 Other specified health status: Secondary | ICD-10-CM

## 2024-04-14 NOTE — Progress Notes (Signed)
 [facility]- [facility]  Subjective:  Patient ID: ISSABELA LESKO, female    DOB: 1956/01/20  Age: 68 y.o. MRN: [id number]  CC: wellness exam   HPI Sheylin B Pinkney presents for wellness exam visit for insurance benefit.  Patient has a PCP: Clemencia Hint PMH significant for: vit d def, osteoporosis.  Last labs per PCP were completed: May 2025  Health Maintenance:  Colonoscopy:reports she is due for this next year.  Mammogram: completed this year.  DEXA: recently done, diagnosed with osteoporosis, will be starting treatment soon.     Smoker: never   Immunizations:  Shingrix-  x1 in 2024  Flu- plans to complete this year.  Covid- declines booster    Lifestyle: Diet- no particular diet, avoids pork.  Exercise- none      Past Medical History:  Diagnosis Date   Arthritis    fingers    Past Surgical History:  Procedure Laterality Date   COLONOSCOPY     MYOMECTOMY      Outpatient Medications Prior to Visit  Medication Sig Dispense Refill   cholecalciferol (VITAMIN D3) 25 MCG (1000 UNIT) tablet Take 5,000 Units by mouth daily.     ibuprofen  (ADVIL ) 600 MG tablet Take 600 mg by mouth 3 (three) times daily as needed.     No facility-administered medications prior to visit.    ROS Review of Systems  HENT:  Positive for hearing loss.   Eyes:  Negative for visual disturbance.  Respiratory:  Negative for shortness of breath.   Cardiovascular:  Negative for chest pain and leg swelling.  Gastrointestinal:  Negative for constipation, diarrhea, nausea and vomiting.  Musculoskeletal:  Positive for arthralgias. Negative for back pain.  Neurological:  Negative for dizziness and headaches.    Objective:  BP 138/70 (BP Location: Left Arm, Patient Position: Sitting, Cuff Size: Normal)   Pulse 67   Temp (!) 97.1 F (36.2 C) (Temporal)   SpO2 98%   Physical Exam Constitutional:      General: She is not in acute distress. HENT:     Head: Normocephalic.     Right  Ear: Tympanic membrane, ear canal and external ear normal.     Left Ear: Tympanic membrane, ear canal and external ear normal.     Mouth/Throat:     Mouth: Mucous membranes are moist.     Pharynx: No oropharyngeal exudate or posterior oropharyngeal erythema.  Eyes:     Pupils: Pupils are equal, round, and reactive to light.  Cardiovascular:     Rate and Rhythm: Normal rate and regular rhythm.     Heart sounds: Normal heart sounds.  Pulmonary:     Effort: Pulmonary effort is normal. No respiratory distress.     Breath sounds: Normal breath sounds.  Abdominal:     General: Bowel sounds are normal.     Palpations: Abdomen is soft.  Musculoskeletal:     Right lower leg: Edema present.     Left lower leg: Edema present.     Comments: Nonpitting edema ankles.   Neurological:     General: No focal deficit present.     Mental Status: She is alert and oriented to person, place, and time.  Psychiatric:        Mood and Affect: Mood normal.        Behavior: Behavior normal.      Assessment & Plan:    Saidee was seen today for wellness exam.  Diagnoses and all orders for this visit:  Participant  in health and wellness plan Adult wellness physical was conducted today. Importance of diet and exercise were discussed in detail.  We reviewed immunizations and gave recommendations regarding current immunization needed for age.  Preventative health exams needed up to date   Patient was advised yearly wellness exam and follow-up with PCP as scheduled.     No orders of the defined types were placed in this encounter.   No orders of the defined types were placed in this encounter.   Follow-up: as needed.
